# Patient Record
Sex: Male | Born: 1965 | Race: Black or African American | Hispanic: No | Marital: Married | Smoking: Former smoker
Health system: Southern US, Community
[De-identification: ages and names within clinical notes are randomized; demographics above are authoritative.]

## PROBLEM LIST (undated history)

## (undated) DIAGNOSIS — E559 Vitamin D deficiency, unspecified: Secondary | ICD-10-CM

---

## 2003-09-13 ENCOUNTER — Emergency Department (HOSPITAL_COMMUNITY): Admission: EM | Admit: 2003-09-13 | Discharge: 2003-09-13 | Payer: Self-pay | Admitting: Emergency Medicine

## 2007-09-18 ENCOUNTER — Ambulatory Visit (HOSPITAL_COMMUNITY): Admission: RE | Admit: 2007-09-18 | Discharge: 2007-09-18 | Payer: Self-pay | Admitting: Pulmonary Disease

## 2013-10-01 NOTE — H&P (Signed)
  NTS SOAP Note  Vital Signs:  Vitals as of: 3/0/0923: Systolic 300: Diastolic 80: Heart Rate 70: Temp 98.86F: Height 76ft 8.5in: Weight 177Lbs 0 Ounces: BMI 26.52  BMI : 26.52 kg/m2  Subjective: This 48 Years 2 Months old Male presents for of a mass in the left arm.  Has been present for some time, but is increasing in size, and causing him discomfort.  Review of Symptoms:  Constitutional:unremarkable   Head:unremarkable    Eyes:unremarkable   sinus problems Cardiovascular:  unremarkable   Respiratory:  wheezing Gastrointestinal:  unremarkable   Genitourinary:unremarkable       joint pain boils Hematolgic/Lymphatic:unremarkable     Allergic/Immunologic:unremarkable     Past Medical History:    Reviewed  Past Medical History  Surgical History: unrremarkable Medical Problems: none Allergies: nkda Medications: vit D   Social History:Reviewed  Social History  Preferred Language: English Race:  Black or African American Ethnicity: Not Hispanic / Latino Age: 48 Years 2 Months Marital Status:  M Alcohol: 1-2 beers a day   Smoking Status: Current every day smoker reviewed on 09/30/2013 Started Date:  Packs per day: 0.50 Functional Status reviewed on 09/30/2013 ------------------------------------------------ Bathing: Normal Cooking: Normal Dressing: Normal Driving: Normal Eating: Normal Managing Meds: Normal Oral Care: Normal Shopping: Normal Toileting: Normal Transferring: Normal Walking: Normal Cognitive Status reviewed on 09/30/2013 ------------------------------------------------ Attention: Normal Decision Making: Normal Language: Normal Memory: Normal Motor: Normal Perception: Normal Problem Solving: Normal Visual and Spatial: Normal   Family History:  Reviewed  Family Health History Mother, Deceased; Cancer unspecified;  Father     Objective Information: General:  Well appearing, well  nourished in no distress.      4cm ovoid, subcutaneous mass noted in left arm.  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   Patient Education:Alternative treatments to surgery were discussed with patient (and family).  Risks and benefits  of procedure were fully explained to the patient (and family) who gave informed consent. Patient/family questions were addressed.  Follow-up:Pending Surgery

## 2013-10-22 NOTE — Patient Instructions (Signed)
Marco Beard  10/22/2013   Your procedure is scheduled on:  10/29/13  Report to [name] at [place] AM.  Call this number if you have problems the morning of surgery: [phone]   Remember:   Do not eat food or drink liquids after midnight.   Take these medicines the morning of surgery with A SIP OF WATER: none   Do not wear jewelry, make-up or nail polish.  Do not wear lotions, powders, or perfumes. You may wear deodorant.  Do not shave 48 hours prior to surgery. Men may shave face and neck.  Do not bring valuables to the hospital.  [facility] is not responsible                  for any belongings or valuables.               Contacts, dentures or bridgework may not be worn into surgery.  Leave suitcase in the car. After surgery it may be brought to your room.  For patients admitted to the hospital, discharge time is determined by your                treatment team.               Patients discharged the day of surgery will not be allowed to drive  home.  Name and phone number of your driver: family  Special Instructions: Shower using CHG 2 nights before surgery and the night before surgery.  If you shower the day of surgery use CHG.  Use special wash - you have one bottle of CHG for all showers.  You should use approximately 1/3 of the bottle for each shower.   Please read over the following fact sheets that you were given: Surgical Site Infection Prevention, Anesthesia Post-op Instructions and Care and Recovery After Surgery   PATIENT INSTRUCTIONS POST-ANESTHESIA  IMMEDIATELY FOLLOWING SURGERY:  Do not drive or operate machinery for the first twenty four hours after surgery.  Do not make any important decisions for twenty four hours after surgery or while taking narcotic pain medications or sedatives.  If you develop intractable nausea and vomiting or a severe headache please notify your doctor immediately.  FOLLOW-UP:  Please make an appointment with your surgeon as instructed.  You do not need to follow up with anesthesia unless specifically instructed to do so.  WOUND CARE INSTRUCTIONS (if applicable):  Keep a dry clean dressing on the anesthesia/puncture wound site if there is drainage.  Once the wound has quit draining you may leave it open to air.  Generally you should leave the bandage intact for twenty four hours unless there is drainage.  If the epidural site drains for more than 36-48 hours please call the anesthesia department.  QUESTIONS?:  Please feel free to call your physician or the hospital operator if you have any questions, and they will be happy to assist you.

## 2013-10-25 ENCOUNTER — Encounter (HOSPITAL_COMMUNITY)
Admission: RE | Admit: 2013-10-25 | Discharge: 2013-10-25 | Disposition: A | Discharge status: Home / Self Care | Payer: BC Managed Care – PPO | Source: Ambulatory Visit | Attending: General Surgery | Admitting: General Surgery

## 2013-10-25 ENCOUNTER — Encounter (HOSPITAL_COMMUNITY): Payer: Self-pay

## 2013-10-25 DIAGNOSIS — Z01812 Encounter for preprocedural laboratory examination: Secondary | ICD-10-CM | POA: Insufficient documentation

## 2013-10-25 HISTORY — DX: Vitamin D deficiency, unspecified: E55.9

## 2013-10-25 LAB — CBC WITH DIFFERENTIAL/PLATELET
BASOS ABS: 0 10*3/uL (ref 0.0–0.1)
Basophils Relative: 0 % (ref 0–1)
EOS ABS: 0.1 10*3/uL (ref 0.0–0.7)
EOS PCT: 1 % (ref 0–5)
HEMATOCRIT: 41.9 % (ref 39.0–52.0)
Hemoglobin: 13.9 g/dL (ref 13.0–17.0)
LYMPHS PCT: 35 % (ref 12–46)
Lymphs Abs: 2.1 10*3/uL (ref 0.7–4.0)
MCH: 28.1 pg (ref 26.0–34.0)
MCHC: 33.2 g/dL (ref 30.0–36.0)
MCV: 84.8 fL (ref 78.0–100.0)
MONO ABS: 0.6 10*3/uL (ref 0.1–1.0)
Monocytes Relative: 10 % (ref 3–12)
Neutro Abs: 3.3 10*3/uL (ref 1.7–7.7)
Neutrophils Relative %: 54 % (ref 43–77)
Platelets: 278 10*3/uL (ref 150–400)
RBC: 4.94 MIL/uL (ref 4.22–5.81)
RDW: 14.3 % (ref 11.5–15.5)
WBC: 6.1 10*3/uL (ref 4.0–10.5)

## 2013-10-25 LAB — BASIC METABOLIC PANEL
BUN: 13 mg/dL (ref 6–23)
CHLORIDE: 105 meq/L (ref 96–112)
CO2: 27 meq/L (ref 19–32)
CREATININE: 1.08 mg/dL (ref 0.50–1.35)
Calcium: 9.3 mg/dL (ref 8.4–10.5)
GFR calc Af Amer: >90 mL/min (ref >90)
GFR calc non Af Amer: 79 mL/min — ABNORMAL LOW (ref >90)
GLUCOSE: 107 mg/dL — AB (ref 70–99)
Potassium: 4 mEq/L (ref 3.7–5.3)
Sodium: 144 mEq/L (ref 137–147)

## 2013-10-29 ENCOUNTER — Ambulatory Visit (HOSPITAL_COMMUNITY): Payer: BC Managed Care – PPO | Admitting: Anesthesiology

## 2013-10-29 ENCOUNTER — Encounter (HOSPITAL_COMMUNITY): Payer: Self-pay | Admitting: *Deleted

## 2013-10-29 ENCOUNTER — Encounter (HOSPITAL_COMMUNITY)
Admission: RE | Disposition: A | Discharge status: Home / Self Care | Payer: Self-pay | Source: Ambulatory Visit | Attending: General Surgery

## 2013-10-29 ENCOUNTER — Ambulatory Visit (HOSPITAL_COMMUNITY)
Admission: RE | Admit: 2013-10-29 | Discharge: 2013-10-29 | Disposition: A | Discharge status: Home / Self Care | Payer: BC Managed Care – PPO | Source: Ambulatory Visit | Attending: General Surgery | Admitting: General Surgery

## 2013-10-29 ENCOUNTER — Encounter (HOSPITAL_COMMUNITY): Payer: BC Managed Care – PPO | Admitting: Anesthesiology

## 2013-10-29 DIAGNOSIS — F17200 Nicotine dependence, unspecified, uncomplicated: Secondary | ICD-10-CM | POA: Insufficient documentation

## 2013-10-29 DIAGNOSIS — D1779 Benign lipomatous neoplasm of other sites: Secondary | ICD-10-CM | POA: Insufficient documentation

## 2013-10-29 HISTORY — PX: MASS EXCISION: SHX2000

## 2013-10-29 SURGERY — EXCISION MASS
Anesthesia: General | Site: Arm Upper | Laterality: Left

## 2013-10-29 MED ORDER — ONDANSETRON HCL 4 MG/2ML IJ SOLN
4.0000 mg | Freq: Once | INTRAMUSCULAR | Status: AC
  Administered 2013-10-29: 4 mg via INTRAVENOUS

## 2013-10-29 MED ORDER — BUPIVACAINE HCL (PF) 0.5 % IJ SOLN
INTRAMUSCULAR | Status: DC | PRN
  Administered 2013-10-29: 3 mL

## 2013-10-29 MED ORDER — ONDANSETRON HCL 4 MG/2ML IJ SOLN
INTRAMUSCULAR | Status: AC
  Filled 2013-10-29: qty 2

## 2013-10-29 MED ORDER — FENTANYL CITRATE 0.05 MG/ML IJ SOLN
INTRAMUSCULAR | Status: AC
Start: 2013-10-29 — End: 2013-10-29
  Filled 2013-10-29: qty 2

## 2013-10-29 MED ORDER — MIDAZOLAM HCL 2 MG/2ML IJ SOLN
1.0000 mg | INTRAMUSCULAR | Status: DC | PRN
  Administered 2013-10-29: 2 mg via INTRAVENOUS

## 2013-10-29 MED ORDER — PROPOFOL 10 MG/ML IV EMUL
INTRAVENOUS | Status: AC
  Filled 2013-10-29: qty 20

## 2013-10-29 MED ORDER — FENTANYL CITRATE 0.05 MG/ML IJ SOLN: 25.0000 ug | INTRAMUSCULAR | Status: DC | PRN

## 2013-10-29 MED ORDER — POVIDONE-IODINE 10 % EX OINT
TOPICAL_OINTMENT | CUTANEOUS | Status: AC
  Filled 2013-10-29: qty 1

## 2013-10-29 MED ORDER — FENTANYL CITRATE 0.05 MG/ML IJ SOLN
INTRAMUSCULAR | Status: DC | PRN
  Administered 2013-10-29 (×2): 25 ug via INTRAVENOUS
  Administered 2013-10-29: 50 ug via INTRAVENOUS

## 2013-10-29 MED ORDER — LACTATED RINGERS IV SOLN
INTRAVENOUS | Status: DC
  Administered 2013-10-29: 07:00:00 via INTRAVENOUS

## 2013-10-29 MED ORDER — POVIDONE-IODINE 10 % OINT PACKET
TOPICAL_OINTMENT | CUTANEOUS | Status: DC | PRN
  Administered 2013-10-29: 1 via TOPICAL

## 2013-10-29 MED ORDER — CHLORHEXIDINE GLUCONATE 4 % EX LIQD: 1.0000 "application " | Freq: Once | CUTANEOUS | Status: DC

## 2013-10-29 MED ORDER — BUPIVACAINE HCL (PF) 0.5 % IJ SOLN
INTRAMUSCULAR | Status: AC
  Filled 2013-10-29: qty 30

## 2013-10-29 MED ORDER — GLYCOPYRROLATE 0.2 MG/ML IJ SOLN
0.2000 mg | Freq: Once | INTRAMUSCULAR | Status: AC
  Administered 2013-10-29: 0.2 mg via INTRAVENOUS

## 2013-10-29 MED ORDER — MIDAZOLAM HCL 2 MG/2ML IJ SOLN
INTRAMUSCULAR | Status: AC
  Filled 2013-10-29: qty 2

## 2013-10-29 MED ORDER — HYDROCODONE-ACETAMINOPHEN 5-325 MG PO TABS: 1.0000 | ORAL_TABLET | Freq: Four times a day (QID) | ORAL | Status: AC | PRN

## 2013-10-29 MED ORDER — ONDANSETRON HCL 4 MG/2ML IJ SOLN: 4.0000 mg | Freq: Once | INTRAMUSCULAR | Status: DC | PRN

## 2013-10-29 MED ORDER — FENTANYL CITRATE 0.05 MG/ML IJ SOLN
25.0000 ug | INTRAMUSCULAR | Status: AC
  Administered 2013-10-29 (×2): 25 ug via INTRAVENOUS

## 2013-10-29 MED ORDER — FENTANYL CITRATE 0.05 MG/ML IJ SOLN
INTRAMUSCULAR | Status: AC
  Filled 2013-10-29: qty 2

## 2013-10-29 MED ORDER — LIDOCAINE HCL (PF) 1 % IJ SOLN
INTRAMUSCULAR | Status: AC
  Filled 2013-10-29: qty 5

## 2013-10-29 MED ORDER — PROPOFOL 10 MG/ML IV BOLUS
INTRAVENOUS | Status: DC | PRN
  Administered 2013-10-29: 170 mg via INTRAVENOUS
  Administered 2013-10-29: 30 mg via INTRAVENOUS

## 2013-10-29 MED ORDER — LIDOCAINE HCL (CARDIAC) 10 MG/ML IV SOLN
INTRAVENOUS | Status: DC | PRN
  Administered 2013-10-29: 20 mg via INTRAVENOUS

## 2013-10-29 MED ORDER — KETOROLAC TROMETHAMINE 30 MG/ML IJ SOLN
30.0000 mg | Freq: Once | INTRAMUSCULAR | Status: AC
  Administered 2013-10-29: 30 mg via INTRAVENOUS

## 2013-10-29 MED ORDER — GLYCOPYRROLATE 0.2 MG/ML IJ SOLN
INTRAMUSCULAR | Status: AC
  Filled 2013-10-29: qty 1

## 2013-10-29 MED ORDER — 0.9 % SODIUM CHLORIDE (POUR BTL) OPTIME
TOPICAL | Status: DC | PRN
  Administered 2013-10-29: 1000 mL

## 2013-10-29 MED ORDER — KETOROLAC TROMETHAMINE 30 MG/ML IJ SOLN
INTRAMUSCULAR | Status: AC
  Filled 2013-10-29: qty 1

## 2013-10-29 SURGICAL SUPPLY — 37 items
BAG HAMPER (MISCELLANEOUS) ×2 IMPLANT
BLADE 15 SAFETY STRL DISP (BLADE) ×2 IMPLANT
CLOTH BEACON ORANGE TIMEOUT ST (SAFETY) ×2 IMPLANT
COVER LIGHT HANDLE STERIS (MISCELLANEOUS) IMPLANT
COVER SURGICAL LIGHT HANDLE (MISCELLANEOUS) ×2 IMPLANT
DECANTER SPIKE VIAL GLASS SM (MISCELLANEOUS) ×2 IMPLANT
DERMABOND ADVANCED (GAUZE/BANDAGES/DRESSINGS)
DERMABOND ADVANCED .7 DNX12 (GAUZE/BANDAGES/DRESSINGS) IMPLANT
DRAPE EENT ADH APERT 15X15 STR (DRAPES) ×2 IMPLANT
DRSG TEGADERM 4X4.75 (GAUZE/BANDAGES/DRESSINGS) ×2 IMPLANT
DURAPREP 26ML APPLICATOR (WOUND CARE) ×2 IMPLANT
ELECT NEEDLE TIP 2.8 STRL (NEEDLE) IMPLANT
ELECT REM PT RETURN 9FT ADLT (ELECTROSURGICAL) ×2
ELECTRODE REM PT RTRN 9FT ADLT (ELECTROSURGICAL) ×1 IMPLANT
FORMALIN 10 PREFIL 120ML (MISCELLANEOUS) ×2 IMPLANT
GLOVE BIOGEL PI IND STRL 8 (GLOVE) ×1 IMPLANT
GLOVE BIOGEL PI INDICATOR 8 (GLOVE) ×1
GLOVE ECLIPSE 7.0 STRL STRAW (GLOVE) ×2 IMPLANT
GLOVE ECLIPSE 7.5 STRL STRAW (GLOVE) IMPLANT
GLOVE EXAM NITRILE LRG STRL (GLOVE) ×2 IMPLANT
GLOVE INDICATOR 7.5 STRL GRN (GLOVE) ×2 IMPLANT
GLOVE SS N UNI LF 7.5 STRL (GLOVE) ×2 IMPLANT
GOWN STRL REUS W/TWL LRG LVL3 (GOWN DISPOSABLE) ×4 IMPLANT
KIT ROOM TURNOVER APOR (KITS) ×2 IMPLANT
MANIFOLD NEPTUNE II (INSTRUMENTS) ×2 IMPLANT
NS IRRIG 1000ML POUR BTL (IV SOLUTION) ×2 IMPLANT
PACK MINOR (CUSTOM PROCEDURE TRAY) ×2 IMPLANT
PAD ARMBOARD 7.5X6 YLW CONV (MISCELLANEOUS) ×2 IMPLANT
SET BASIN LINEN APH (SET/KITS/TRAYS/PACK) ×2 IMPLANT
SPONGE GAUZE 2X2 8PLY STRL LF (GAUZE/BANDAGES/DRESSINGS) ×4 IMPLANT
SUT ETHILON 3 0 FSL (SUTURE) IMPLANT
SUT PROLENE 3 0 PS 2 (SUTURE) ×6 IMPLANT
SUT PROLENE 4 0 PS 2 18 (SUTURE) IMPLANT
SUT VIC AB 3-0 SH 27 (SUTURE)
SUT VIC AB 3-0 SH 27X BRD (SUTURE) IMPLANT
SUT VIC AB 4-0 PS2 27 (SUTURE) IMPLANT
SYR CONTROL 10ML LL (SYRINGE) ×2 IMPLANT

## 2013-10-29 NOTE — Discharge Instructions (Signed)

## 2013-10-29 NOTE — Anesthesia Procedure Notes (Signed)
Procedure Name: LMA Insertion Date/Time: 10/29/2013 7:34 AM Performed by: Vista Deck Pre-anesthesia Checklist: Patient identified, Patient being monitored, Emergency Drugs available, Timeout performed and Suction available Patient Re-evaluated:Patient Re-evaluated prior to inductionOxygen Delivery Method: Circle System Utilized Preoxygenation: Pre-oxygenation with 100% oxygen Intubation Type: IV induction Ventilation: Mask ventilation without difficulty LMA: LMA inserted LMA Size: 4.0 Number of attempts: 1 Placement Confirmation: positive ETCO2 and breath sounds checked- equal and bilateral Tube secured with: Tape

## 2013-10-29 NOTE — Anesthesia Preprocedure Evaluation (Signed)
Anesthesia Evaluation  Patient identified by MRN, date of birth, ID band Patient awake    Reviewed: Allergy & Precautions, H&P , NPO status , Patient's Chart, lab work & pertinent test results  Airway Mallampati: I TM Distance: >3 FB Neck ROM: Full    Dental  (+) Teeth Intact, Dental Advisory Given   Pulmonary Current Smoker,  breath sounds clear to auscultation        Cardiovascular negative cardio ROS  Rhythm:Regular Rate:Normal     Neuro/Psych    GI/Hepatic negative GI ROS,   Endo/Other    Renal/GU      Musculoskeletal   Abdominal   Peds  Hematology   Anesthesia Other Findings   Reproductive/Obstetrics                           Anesthesia Physical Anesthesia Plan  ASA: II  Anesthesia Plan: General   Post-op Pain Management:    Induction: Intravenous  Airway Management Planned: LMA  Additional Equipment:   Intra-op Plan:   Post-operative Plan: Extubation in OR  Informed Consent: I have reviewed the patients History and Physical, chart, labs and discussed the procedure including the risks, benefits and alternatives for the proposed anesthesia with the patient or authorized representative who has indicated his/her understanding and acceptance.     Plan Discussed with:   Anesthesia Plan Comments:         Anesthesia Quick Evaluation

## 2013-10-29 NOTE — Transfer of Care (Signed)
Immediate Anesthesia Transfer of Care Note  Patient: Marco Beard  Procedure(s) Performed: Procedure(s) (LRB): EXCISION 5CM SOFT TISSUE NEOPLASM OF ARM (Left)  Patient Location: PACU  Anesthesia Type: General  Level of Consciousness: awake  Airway & Oxygen Therapy: Patient Spontanous Breathing and non-rebreather face mask  Post-op Assessment: Report given to PACU RN, Post -op Vital signs reviewed and stable and Patient moving all extremities  Post vital signs: Reviewed and stable  Complications: No apparent anesthesia complications

## 2013-10-29 NOTE — Anesthesia Postprocedure Evaluation (Signed)
  Anesthesia Post-op Note  Patient: Marco Beard  Procedure(s) Performed: Procedure(s): EXCISION 5CM SOFT TISSUE NEOPLASM OF ARM (Left)  Patient Location: PACU  Anesthesia Type:General  Level of Consciousness: sedated and patient cooperative  Airway and Oxygen Therapy: Patient Spontanous Breathing and non-rebreather face mask  Post-op Pain: none  Post-op Assessment: Post-op Vital signs reviewed, Patient's Cardiovascular Status Stable and Respiratory Function Stable  Post-op Vital Signs: Reviewed and stable  Last Vitals:  Filed Vitals:   10/29/13 0824  BP: 101/56  Pulse: 68  Temp: 36.4 C  Resp: 20    Complications: No apparent anesthesia complications

## 2013-10-29 NOTE — Interval H&P Note (Signed)
History and Physical Interval Note:  10/29/2013 7:13 AM  Marco Beard  has presented today for surgery, with the diagnosis of neoplasm of left arm  The various methods of treatment have been discussed with the patient and family. After consideration of risks, benefits and other options for treatment, the patient has consented to  Procedure(s): EXCISION 5CM SOFT TISSUE NEOPLASM OF ARM (Left) as a surgical intervention .  The patient's history has been reviewed, patient examined, no change in status, stable for surgery.  I have reviewed the patient's chart and labs.  Questions were answered to the patient's satisfaction.     Jamesetta So

## 2013-10-29 NOTE — Op Note (Signed)
Patient:  Marco Beard  DOB:  12-04-1965  MRN:  [id number]   Preop Diagnosis:  Soft tissue neoplasm, left arm  Postop Diagnosis:  Same  Procedure:  Excision of 5 cm soft tissue neoplasm, left arm  Surgeon:  Aviva Signs, M.D.  Anes:  General  Indications:  Patient is a 48 year old black male who presents with an enlarging soft tissue mass in the left arm. The risks and benefits of the procedure were fully explained to the patient, gave informed consent.  Procedure note:  Patient is placed the supine position. After general anesthesia was administered, the left arm was prepped and draped using usual sterile technique with DuraPrep. Surgical site confirmation was performed.  A 5 cm ovoid mass was noted just above the left elbow. A longitudinal incision was made. This was taken down to the mass which appeared to be lipomatous in nature, though it interdigitated with the muscle down to the humerus. It was carefully excised with minimal use of Bovie electrocautery. It was sent to pathology further examination. 0.5% Sensorcaine was instilled the surrounding wound. The incision was closed using 3-0 Prolene vertical mattress sutures. Betadine ointment and dressed a dressing were applied.  All tape and needle counts were correct at the end of the procedure. Patient was awakened and transferred to PACU in stable condition.  Complications:  None  EBL:  Minimal  Specimen:  Soft tissue mass, left arm

## 2013-11-02 ENCOUNTER — Encounter (HOSPITAL_COMMUNITY): Payer: Self-pay | Admitting: General Surgery

## 2014-09-13 ENCOUNTER — Other Ambulatory Visit (HOSPITAL_COMMUNITY): Payer: Self-pay | Admitting: General Surgery

## 2014-09-13 DIAGNOSIS — R2230 Localized swelling, mass and lump, unspecified upper limb: Secondary | ICD-10-CM

## 2014-09-13 DIAGNOSIS — R221 Localized swelling, mass and lump, neck: Secondary | ICD-10-CM

## 2014-09-16 ENCOUNTER — Ambulatory Visit (HOSPITAL_COMMUNITY)
Admission: RE | Admit: 2014-09-16 | Discharge: 2014-09-16 | Disposition: A | Discharge status: Home / Self Care | Payer: BLUE CROSS/BLUE SHIELD | Source: Ambulatory Visit | Attending: General Surgery | Admitting: General Surgery

## 2014-09-16 DIAGNOSIS — M259 Joint disorder, unspecified: Secondary | ICD-10-CM | POA: Diagnosis not present

## 2014-09-16 DIAGNOSIS — R221 Localized swelling, mass and lump, neck: Secondary | ICD-10-CM | POA: Insufficient documentation

## 2014-09-16 DIAGNOSIS — R2230 Localized swelling, mass and lump, unspecified upper limb: Secondary | ICD-10-CM

## 2014-09-16 MED ORDER — IOHEXOL 300 MG/ML  SOLN: 125.0000 mL | Freq: Once | INTRAMUSCULAR | Status: AC | PRN

## 2014-09-22 ENCOUNTER — Ambulatory Visit: Payer: BLUE CROSS/BLUE SHIELD | Admitting: Orthopedic Surgery

## 2014-10-05 ENCOUNTER — Ambulatory Visit (INDEPENDENT_AMBULATORY_CARE_PROVIDER_SITE_OTHER): Payer: BLUE CROSS/BLUE SHIELD | Admitting: Orthopedic Surgery

## 2014-10-05 VITALS — BP 128/88 | Ht 68.0 in | Wt 182.0 lb

## 2014-10-05 DIAGNOSIS — C4911 Malignant neoplasm of connective and soft tissue of right upper limb, including shoulder: Secondary | ICD-10-CM

## 2014-10-05 NOTE — Patient Instructions (Signed)
We will refer you to [facility], Dr Mylo Red for second opinion

## 2014-10-06 ENCOUNTER — Encounter: Payer: Self-pay | Admitting: Orthopedic Surgery

## 2014-10-06 NOTE — Progress Notes (Addendum)
Patient ID: Marco Beard, male   DOB: 06-18-1966, 49 y.o.   MRN: [id number] Patient ID: Marco Beard, male   DOB: [birth year], 49 y.o.   MRN: [id number]  Chief Complaint  Patient presents with  . Back Problem    Lipoma on right scapula, referred by Dr. Romona Curls. CT scan at [facility].     Marco Beard is a 49 y.o. male.   HPI The patient is referred to me for evaluation of a lipoma on the right side of the patient's shoulder and neck he's already had a CT scan. He complains of no pain just swelling. He noticed the lesion several months ago and wanted it checked out. It is not affecting his shoulder motion in any way it is not affecting any strength in his arm or function of his shoulder. Review of Systems In terms of review of systems he does say that he occasionally has some night sweats seasonal at, wheezing and cough. Dental problems and sinusitis. He has not noticed any weight loss fever or chills. No neurologic deficits in the upper extremity. He does have history of prior lipomas being excised  Past Medical History  Diagnosis Date  . Vitamin D deficiency     Past Surgical History  Procedure Laterality Date  . Mass excision Left 10/29/2013    Procedure: EXCISION 5CM SOFT TISSUE NEOPLASM OF ARM;  Surgeon: Jamesetta So, MD;  Location: [facility];  Service: General;  Laterality: Left;    History reviewed. No pertinent family history.  Social History History  Substance Use Topics  . Smoking status: Current Every Day Smoker -- 0.50 packs/day  . Smokeless tobacco: Not on file  . Alcohol Use: 1.8 oz/week    3 Cans of beer per week     Comment: occ on weekend    No Known Allergies  Current Outpatient Prescriptions  Medication Sig Dispense Refill  . HYDROcodone-acetaminophen (NORCO/VICODIN) 5-325 MG per tablet Take 1-2 tablets by mouth every 6 (six) hours as needed for moderate pain. 40 tablet 0  . Vitamin D, Ergocalciferol, (DRISDOL) 50000 UNITS CAPS capsule Take 50,000 Units  by mouth every 7 (seven) days.     No current facility-administered medications for this visit.       Physical Exam Blood pressure 128/88, height 5\' 8"  (1.727 m), weight 182 lb (82.555 kg). Physical Exam The patient is well developed well nourished and well groomed. Orientation to person place and time is normal  Mood is pleasant. Ambulatory status normal ambulatory status noncontributory Neck and shoulder exam reveals full range of motion of the cervical spine without tenderness there is a mass noted in the right paraspinal musculature and trapezius muscle which tracks down above the right periscapular region it is a rather large lesion. He has full range of motion of his right shoulder with no axillary or clavicular area or supraclavicular area or. Spinal lymphadenopathy. His shoulder remain stable muscle tone is normal strength is good scans normal sensations excellent and vascularity is normal as well  Data Reviewed We do have a CT scan which shows that the lesion has some characteristics of liposarcoma.  Assessment Encounter Diagnosis  Name Primary?  . 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 I told him that I'm happy to remove this after the opinion as long as it's a benign lipoma. I would like to have general surgery assist in the removal of this  lesion as it does track around the cervical area  These are the CT scan results IMPRESSION: 1. Palpable area of concern corresponds to a large 3.7 x 4.0 x 12 cm (AP x transverse x CC) circumscribed lipomatous mass deep to the superficial muscle layer and bounded by the right trapezius, levator scapula, and erector spinae muscles. The entire mass may be benign and in fact the cephalad 2/3 of the mass has simple density in keeping with a simple lipoma (see sagittal series 10 image 44), however, the bottom 2/3 of the mass has more  increased reticular density which can be seen with low grade liposarcoma. There is no associated lymphadenopathy or osseous changes. 2. Right lower lobe peribronchovascular pulmonary opacity compatible with mild or developing bronchopneumonia. Has the patient recently had flu/pneumonia symptoms? If so this may reflect a resolving infection. 3. Extensive abnormal periapical lucency about the maxillary dentition indicating chronic dental inflammation. Recommend dental followup.

## 2014-10-13 ENCOUNTER — Telehealth: Payer: Self-pay | Admitting: Orthopedic Surgery

## 2014-10-13 NOTE — Telephone Encounter (Signed)
Patient called, asking about referral to [facility] hasn't heard - please advise, cell# [phone]

## 2014-10-18 ENCOUNTER — Other Ambulatory Visit: Payer: Self-pay | Admitting: *Deleted

## 2014-10-18 ENCOUNTER — Telehealth: Payer: Self-pay | Admitting: *Deleted

## 2014-10-18 DIAGNOSIS — C4911 Malignant neoplasm of connective and soft tissue of right upper limb, including shoulder: Secondary | ICD-10-CM

## 2014-10-18 NOTE — Telephone Encounter (Signed)
APPT 4/26 @ 9:30AM W/ DR EMORY  [address] [place] [address] FLOOR  CALLED PATIENT, NO ANSWER

## 2014-10-18 NOTE — Telephone Encounter (Signed)
Patient is aware 

## 2014-10-18 NOTE — Telephone Encounter (Signed)
Patient aware.

## 2014-10-18 NOTE — Telephone Encounter (Signed)
REFERRAL FAXED TO [facility] TO DR Mylo Red

## 2015-08-04 ENCOUNTER — Ambulatory Visit (INDEPENDENT_AMBULATORY_CARE_PROVIDER_SITE_OTHER): Payer: BLUE CROSS/BLUE SHIELD | Admitting: Urology

## 2015-08-04 DIAGNOSIS — M545 Low back pain: Secondary | ICD-10-CM | POA: Diagnosis not present

## 2015-08-04 DIAGNOSIS — N50812 Left testicular pain: Secondary | ICD-10-CM | POA: Diagnosis not present

## 2015-08-04 DIAGNOSIS — N50811 Right testicular pain: Secondary | ICD-10-CM

## 2015-10-06 ENCOUNTER — Ambulatory Visit (INDEPENDENT_AMBULATORY_CARE_PROVIDER_SITE_OTHER): Payer: BLUE CROSS/BLUE SHIELD | Admitting: Urology

## 2015-10-06 DIAGNOSIS — N50811 Right testicular pain: Secondary | ICD-10-CM | POA: Diagnosis not present

## 2015-10-06 DIAGNOSIS — N50812 Left testicular pain: Secondary | ICD-10-CM | POA: Diagnosis not present

## 2015-12-15 ENCOUNTER — Ambulatory Visit (INDEPENDENT_AMBULATORY_CARE_PROVIDER_SITE_OTHER): Payer: BLUE CROSS/BLUE SHIELD | Admitting: Urology

## 2015-12-15 DIAGNOSIS — N5201 Erectile dysfunction due to arterial insufficiency: Secondary | ICD-10-CM

## 2015-12-15 DIAGNOSIS — M545 Low back pain: Secondary | ICD-10-CM

## 2015-12-15 DIAGNOSIS — N50811 Right testicular pain: Secondary | ICD-10-CM

## 2015-12-15 DIAGNOSIS — N50812 Left testicular pain: Secondary | ICD-10-CM | POA: Diagnosis not present

## 2016-02-16 ENCOUNTER — Ambulatory Visit: Payer: BLUE CROSS/BLUE SHIELD | Admitting: Urology

## 2016-03-01 ENCOUNTER — Ambulatory Visit: Payer: BLUE CROSS/BLUE SHIELD | Admitting: Urology

## 2016-03-22 ENCOUNTER — Ambulatory Visit (INDEPENDENT_AMBULATORY_CARE_PROVIDER_SITE_OTHER): Payer: BLUE CROSS/BLUE SHIELD | Admitting: Urology

## 2016-03-22 DIAGNOSIS — N50812 Left testicular pain: Secondary | ICD-10-CM | POA: Diagnosis not present

## 2016-03-22 DIAGNOSIS — M545 Low back pain: Secondary | ICD-10-CM

## 2016-03-22 DIAGNOSIS — N5201 Erectile dysfunction due to arterial insufficiency: Secondary | ICD-10-CM | POA: Diagnosis not present

## 2016-03-22 DIAGNOSIS — N4341 Spermatocele of epididymis, single: Secondary | ICD-10-CM | POA: Diagnosis not present

## 2016-04-02 ENCOUNTER — Other Ambulatory Visit: Payer: Self-pay | Admitting: Urology

## 2016-04-02 DIAGNOSIS — M545 Low back pain: Secondary | ICD-10-CM

## 2016-04-08 ENCOUNTER — Encounter (HOSPITAL_COMMUNITY): Payer: Self-pay

## 2016-04-08 ENCOUNTER — Ambulatory Visit (HOSPITAL_COMMUNITY)
Admission: RE | Admit: 2016-04-08 | Discharge: 2016-04-08 | Disposition: A | Discharge status: Home / Self Care | Payer: BLUE CROSS/BLUE SHIELD | Source: Ambulatory Visit | Attending: Urology | Admitting: Urology

## 2016-04-08 DIAGNOSIS — M545 Low back pain: Secondary | ICD-10-CM

## 2016-04-09 ENCOUNTER — Ambulatory Visit (HOSPITAL_COMMUNITY)
Admission: RE | Admit: 2016-04-09 | Discharge: 2016-04-09 | Disposition: A | Discharge status: Home / Self Care | Payer: BLUE CROSS/BLUE SHIELD | Source: Ambulatory Visit | Attending: Urology | Admitting: Urology

## 2016-04-09 ENCOUNTER — Encounter (HOSPITAL_COMMUNITY): Payer: Self-pay

## 2016-04-09 ENCOUNTER — Ambulatory Visit (HOSPITAL_COMMUNITY): Admission: RE | Admit: 2016-04-09 | Payer: BLUE CROSS/BLUE SHIELD | Source: Ambulatory Visit

## 2016-04-09 ENCOUNTER — Other Ambulatory Visit: Payer: Self-pay | Admitting: Urology

## 2016-04-09 DIAGNOSIS — Z1810 Retained metal fragments, unspecified: Secondary | ICD-10-CM | POA: Insufficient documentation

## 2016-04-09 DIAGNOSIS — M795 Residual foreign body in soft tissue: Secondary | ICD-10-CM

## 2016-04-09 DIAGNOSIS — M545 Low back pain: Secondary | ICD-10-CM

## 2016-04-09 DIAGNOSIS — Z01818 Encounter for other preprocedural examination: Secondary | ICD-10-CM | POA: Diagnosis present

## 2016-05-29 ENCOUNTER — Other Ambulatory Visit: Payer: Self-pay | Admitting: Urology

## 2016-05-29 DIAGNOSIS — M545 Low back pain, unspecified: Secondary | ICD-10-CM

## 2016-05-29 DIAGNOSIS — N50812 Left testicular pain: Secondary | ICD-10-CM

## 2016-06-03 ENCOUNTER — Ambulatory Visit (HOSPITAL_COMMUNITY)
Admission: RE | Admit: 2016-06-03 | Discharge: 2016-06-03 | Disposition: A | Discharge status: Home / Self Care | Payer: BLUE CROSS/BLUE SHIELD | Source: Ambulatory Visit | Attending: Urology | Admitting: Urology

## 2016-06-03 DIAGNOSIS — M545 Low back pain, unspecified: Secondary | ICD-10-CM

## 2016-06-03 DIAGNOSIS — M5136 Other intervertebral disc degeneration, lumbar region: Secondary | ICD-10-CM | POA: Insufficient documentation

## 2016-06-03 DIAGNOSIS — M48061 Spinal stenosis, lumbar region without neurogenic claudication: Secondary | ICD-10-CM | POA: Insufficient documentation

## 2016-06-03 DIAGNOSIS — N50812 Left testicular pain: Secondary | ICD-10-CM | POA: Diagnosis present

## 2016-06-14 ENCOUNTER — Ambulatory Visit: Payer: BLUE CROSS/BLUE SHIELD | Admitting: Urology
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
## 2017-05-16 ENCOUNTER — Ambulatory Visit: Payer: BLUE CROSS/BLUE SHIELD | Admitting: Urology

## 2017-05-16 DIAGNOSIS — N50811 Right testicular pain: Secondary | ICD-10-CM

## 2017-05-16 DIAGNOSIS — N5201 Erectile dysfunction due to arterial insufficiency: Secondary | ICD-10-CM

## 2017-05-16 DIAGNOSIS — M4807 Spinal stenosis, lumbosacral region: Secondary | ICD-10-CM | POA: Diagnosis not present

## 2017-05-16 DIAGNOSIS — N50812 Left testicular pain: Secondary | ICD-10-CM | POA: Diagnosis not present

## 2018-05-22 ENCOUNTER — Ambulatory Visit: Payer: BLUE CROSS/BLUE SHIELD | Admitting: Urology

## 2018-05-22 DIAGNOSIS — N50812 Left testicular pain: Secondary | ICD-10-CM

## 2018-05-22 DIAGNOSIS — N50811 Right testicular pain: Secondary | ICD-10-CM

## 2018-05-22 DIAGNOSIS — N5201 Erectile dysfunction due to arterial insufficiency: Secondary | ICD-10-CM | POA: Diagnosis not present

## 2018-06-05 ENCOUNTER — Encounter: Payer: Self-pay | Admitting: Orthopaedic Surgery

## 2018-10-25 ENCOUNTER — Encounter: Payer: Self-pay | Admitting: Orthopaedic Surgery

## 2019-07-09 ENCOUNTER — Ambulatory Visit: Payer: BLUE CROSS/BLUE SHIELD | Admitting: Urology

## 2019-07-16 ENCOUNTER — Telehealth: Payer: Self-pay | Admitting: Urology

## 2019-07-16 ENCOUNTER — Other Ambulatory Visit: Payer: Self-pay

## 2019-07-16 DIAGNOSIS — N5201 Erectile dysfunction due to arterial insufficiency: Secondary | ICD-10-CM

## 2019-07-16 MED ORDER — SILDENAFIL CITRATE 20 MG PO TABS: ORAL_TABLET | ORAL | 3 refills | Status: DC
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
## 2019-07-16 NOTE — Telephone Encounter (Signed)
He can have a refill.

## 2019-07-16 NOTE — Telephone Encounter (Signed)
Prescription sent in to [facility]. Called pt. No answer.

## 2019-08-13 ENCOUNTER — Ambulatory Visit: Payer: BC Managed Care – PPO | Admitting: Urology

## 2019-09-24 ENCOUNTER — Ambulatory Visit: Payer: BC Managed Care – PPO | Admitting: Urology

## 2019-09-24 ENCOUNTER — Other Ambulatory Visit: Payer: Self-pay

## 2019-09-24 VITALS — BP 122/75 | HR 76 | Temp 98.6°F | Ht 68.0 in | Wt 177.0 lb

## 2019-09-24 DIAGNOSIS — N5201 Erectile dysfunction due to arterial insufficiency: Secondary | ICD-10-CM | POA: Diagnosis not present

## 2019-09-24 DIAGNOSIS — N50812 Left testicular pain: Secondary | ICD-10-CM | POA: Diagnosis not present

## 2019-09-24 DIAGNOSIS — L729 Follicular cyst of the skin and subcutaneous tissue, unspecified: Secondary | ICD-10-CM

## 2019-09-24 DIAGNOSIS — N50811 Right testicular pain: Secondary | ICD-10-CM | POA: Diagnosis not present

## 2019-09-24 LAB — POCT URINALYSIS DIPSTICK
Bilirubin, UA: NEGATIVE
Blood, UA: NEGATIVE
Glucose, UA: NEGATIVE
Nitrite, UA: NEGATIVE
Protein, UA: POSITIVE — AB
Spec Grav, UA: 1.02 (ref 1.010–1.025)
Urobilinogen, UA: 1 E.U./dL
pH, UA: 7 (ref 5.0–8.0)

## 2019-09-24 MED ORDER — SILDENAFIL CITRATE 20 MG PO TABS: ORAL_TABLET | ORAL | 3 refills | Status: DC

## 2019-09-24 NOTE — Progress Notes (Signed)
Subjective:  1. Erectile dysfunction due to arterial insufficiency   2. Scrotal cyst   3. Pain in both testicles      Mr. Marco Beard returns today in f/u for his history of testicular pain and a scrotal cyst that might be larger but is no longer painful.  The bilateral groin pain, right > left, has largely abated.  He has minimal voiding complaints and his IPSS is 2.   He has ED and has been using sildenafil with success.   He has a mid scrotal cyst that he thinks is enlarged a bit more but he has no pain.    He has cervical disc disease and is going to have surgery since he is getting right arm weakness.    IPSS    Row Name 09/24/19 1300         International Prostate Symptom Score   How often have you had the sensation of not emptying your bladder?  Not at All     How often have you had to urinate less than every two hours?  Not at All     How often have you found you stopped and started again several times when you urinated?  Not at All     How often have you found it difficult to postpone urination?  Less than 1 in 5 times     How often have you had a weak urinary stream?  Not at All     How often have you had to strain to start urination?  Not at All     How many times did you typically get up at night to urinate?  1 Time     Total IPSS Score  2       Quality of Life due to urinary symptoms   If you were to spend the rest of your life with your urinary condition just the way it is now how would you feel about that?  Delighted         ROS:  ROS:  A complete review of systems was performed.  All systems are negative except for pertinent findings as noted.   Review of Systems  All other systems reviewed and are negative.   No Known Allergies  Outpatient Encounter Medications as of 09/24/2019  Medication Sig  . dorzolamide-timolol (COSOPT) 22.3-6.8 MG/ML ophthalmic solution PLACE 1 DROP INTO RIGHT EYE TWICE DAILY  . gabapentin (NEURONTIN) 100 MG capsule Take 1-3 caps  by mouth nightly  . sildenafil (REVATIO) 20 MG tablet 1 to 5 tablets as needed by mouth for intimacy  . Vitamin D, Ergocalciferol, (DRISDOL) 50000 UNITS CAPS capsule Take 50,000 Units by mouth every 7 (seven) days.  Marland Kitchen VYZULTA 0.024 % SOLN Place 1 drop into the right eye at bedtime.  . [DISCONTINUED] sildenafil (REVATIO) 20 MG tablet 1 to 5 tablets as needed by mouth for intimacy   No facility-administered encounter medications on file as of 09/24/2019.    Past Medical History:  Diagnosis Date  . Vitamin D deficiency     Past Surgical History:  Procedure Laterality Date  . MASS EXCISION Left 10/29/2013   Procedure: EXCISION 5CM SOFT TISSUE NEOPLASM OF ARM;  Surgeon: Jamesetta So, MD;  Location: [facility];  Service: General;  Laterality: Left;    Social History   Socioeconomic History  . Marital status: Married    Spouse name: Not on file  . Number of children: Not on file  . Years of education: Not on  file  . Highest education level: Not on file  Occupational History  . Not on file  Tobacco Use  . Smoking status: Current Every Day Smoker    Packs/day: 0.50  Substance and Sexual Activity  . Alcohol use: Yes    Alcohol/week: 3.0 standard drinks    Types: 3 Cans of beer per week    Comment: occ on weekend  . Drug use: No  . Sexual activity: Not on file  Other Topics Concern  . Not on file  Social History Narrative  . Not on file   Social Determinants of Health   Financial Resource Strain:   . Difficulty of Paying Living Expenses:   Food Insecurity:   . Worried About Charity fundraiser in the Last Year:   . Arboriculturist in the Last Year:   Transportation Needs:   . Film/video editor (Medical):   Marland Kitchen Lack of Transportation (Non-Medical):   Physical Activity:   . Days of Exercise per Week:   . Minutes of Exercise per Session:   Stress:   . Feeling of Stress :   Social Connections:   . Frequency of Communication with Friends and Family:   . Frequency of Social  Gatherings with Friends and Family:   . Attends Religious Services:   . Active Member of Clubs or Organizations:   . Attends Archivist Meetings:   Marland Kitchen Marital Status:   Intimate Partner Violence:   . Fear of Current or Ex-Partner:   . Emotionally Abused:   Marland Kitchen Physically Abused:   . Sexually Abused:     No family history on file.     Objective: BP 122/75   Pulse 76   Temp 98.6 F (37 C)   Ht 5\' 8"  (1.727 m)   Wt 177 lb (80.3 kg)   BMI 26.91 kg/m     Physical Exam Vitals reviewed.  Constitutional:      Appearance: Normal appearance.  Genitourinary:    Comments: There is a 43mm cyst in the right posterior scrotum that is minimally changed.  The genital exam is otherwise unremarkable.  Neurological:     Mental Status: He is alert.     Lab Results:  Results for orders placed or performed in visit on 09/24/19 (from the past 24 hour(s))  POCT urinalysis dipstick     Status: Abnormal   Collection Time: 09/24/19  1:21 PM  Result Value Ref Range   Color, UA yellow    Clarity, UA     Glucose, UA Negative Negative   Bilirubin, UA neg    Ketones, UA trace    Spec Grav, UA 1.020 1.010 - 1.025   Blood, UA neg    pH, UA 7.0 5.0 - 8.0   Protein, UA Positive (A) Negative   Urobilinogen, UA 1.0 0.2 or 1.0 E.U./dL   Nitrite, UA neg    Leukocytes, UA Trace (A) Negative   Appearance     Odor final     BMET No results for input(s): NA, K, CL, CO2, GLUCOSE, BUN, CREATININE, CALCIUM in the last 72 hours. PSA No results found for: PSA No results found for: TESTOSTERONE    Studies/Results: No results found.    Assessment & Plan: ED.  He is doing well on sildenafil which I refilled.  Scrotal cyst.  No major change and his groin pain has improved.  I discussed prostate cancer screening but he wants to hold off until after he has had his  cervical disc surgery.   Meds ordered this encounter  Medications  . sildenafil (REVATIO) 20 MG tablet    Sig: 1 to 5  tablets as needed by mouth for intimacy    Dispense:  30 tablet    Refill:  3     Orders Placed This Encounter  Procedures  . POCT urinalysis dipstick      Return in about 1 year (around 09/23/2020) for for routine f/u.Marland Kitchen   CC: System, [place] Not In      Irine Seal 09/24/2019

## 2020-03-05 ENCOUNTER — Other Ambulatory Visit: Payer: Self-pay | Admitting: Urology

## 2020-03-05 DIAGNOSIS — N5201 Erectile dysfunction due to arterial insufficiency: Secondary | ICD-10-CM

## 2020-06-21 ENCOUNTER — Other Ambulatory Visit: Payer: Self-pay

## 2020-06-21 ENCOUNTER — Telehealth: Payer: Self-pay

## 2020-06-21 DIAGNOSIS — N5201 Erectile dysfunction due to arterial insufficiency: Secondary | ICD-10-CM

## 2020-06-21 MED ORDER — SILDENAFIL CITRATE 20 MG PO TABS: ORAL_TABLET | ORAL | 3 refills | Status: DC

## 2020-06-21 NOTE — Telephone Encounter (Signed)
Pt called wanting a refill on Sildenafil. Rx sent and notified. Stated [facility] was wrong pharmacy. Rx was sent to [facility] and [facility] taken out as pharmacy.

## 2020-09-18 NOTE — Progress Notes (Signed)
Subjective:  1. Scrotal cyst   2. Erectile dysfunction due to arterial insufficiency   3. Prostate cancer screening      Marco Beard returns today in f/u for his history of testicular pain and a scrotal cyst.  The cyst is stable and without pain.  He has minimal voiding complaints.   He has ED and has been using sildenafil with success.      ROS:  ROS:  A complete review of systems was performed.  All systems are negative except for pertinent findings as noted.   Review of Systems  All other systems reviewed and are negative.   No Known Allergies  Outpatient Encounter Medications as of 09/21/2020  Medication Sig  . dorzolamide-timolol (COSOPT) 22.3-6.8 MG/ML ophthalmic solution PLACE 1 DROP INTO RIGHT EYE TWICE DAILY  . VYZULTA 0.024 % SOLN Place 1 drop into the right eye at bedtime.  . [DISCONTINUED] gabapentin (NEURONTIN) 100 MG capsule Take 1-3 caps by mouth nightly  . [DISCONTINUED] sildenafil (REVATIO) 20 MG tablet TAKE 1 TO 5 TABLETS AS NEEDED BY MOUTH FOR INTIMACY  . [DISCONTINUED] Vitamin D, Ergocalciferol, (DRISDOL) 50000 UNITS CAPS capsule Take 50,000 Units by mouth every 7 (seven) days.  . sildenafil (REVATIO) 20 MG tablet TAKE 1 TO 5 TABLETS AS NEEDED BY MOUTH FOR INTIMACY  . [DISCONTINUED] sildenafil (REVATIO) 20 MG tablet TAKE 1 TO 5 TABLETS AS NEEDED BY MOUTH FOR INTIMACY   No facility-administered encounter medications on file as of 09/21/2020.    Past Medical History:  Diagnosis Date  . Vitamin D deficiency     Past Surgical History:  Procedure Laterality Date  . MASS EXCISION Left 10/29/2013   Procedure: EXCISION 5CM SOFT TISSUE NEOPLASM OF ARM;  Surgeon: Jamesetta So, MD;  Location: [facility];  Service: General;  Laterality: Left;    Social History   Socioeconomic History  . Marital status: Married    Spouse name: Not on file  . Number of children: Not on file  . Years of education: Not on file  . Highest education level: Not on file   Occupational History  . Not on file  Tobacco Use  . Smoking status: Current Every Day Smoker    Packs/day: 0.50  . Smokeless tobacco: Never Used  Substance and Sexual Activity  . Alcohol use: Yes    Alcohol/week: 3.0 standard drinks    Types: 3 Cans of beer per week    Comment: occ on weekend  . Drug use: No  . Sexual activity: Not on file  Other Topics Concern  . Not on file  Social History Narrative  . Not on file   Social Determinants of Health   Financial Resource Strain: Not on file  Food Insecurity: Not on file  Transportation Needs: Not on file  Physical Activity: Not on file  Stress: Not on file  Social Connections: Not on file  Intimate Partner Violence: Not on file    No family history on file.     Objective: BP (!) 146/81   Pulse 75   Temp 98.6 F (37 C) (Oral)   Ht 5\' 8"  (1.727 m)   Wt 178 lb (80.7 kg)   BMI 27.06 kg/m     Physical Exam Vitals reviewed.  Constitutional:      Appearance: Normal appearance.  Genitourinary:    Comments: There is a 58mm cyst in the right posterior scrotum that is minimally changed.  The genital exam is otherwise unremarkable.   AP without lesions. NST  without mass. Prostate 1.5+ benign. SV non-palpable.  Neurological:     Mental Status: He is alert.     Lab Results:  Results for orders placed or performed in visit on 09/21/20 (from the past 24 hour(s))  Urinalysis, Routine w reflex microscopic     Status: Abnormal   Collection Time: 09/21/20  1:47 PM  Result Value Ref Range   Specific Gravity, UA 1.025 1.005 - 1.030   pH, UA 7.0 5.0 - 7.5   Color, UA Yellow Yellow   Appearance Ur Clear Clear   Leukocytes,UA Negative Negative   Protein,UA Negative Negative/Trace   Glucose, UA Negative Negative   Ketones, UA Negative Negative   RBC, UA Trace (A) Negative   Bilirubin, UA Negative Negative   Urobilinogen, Ur 1.0 0.2 - 1.0 mg/dL   Nitrite, UA Negative Negative   Microscopic Examination See below:     Narrative   Performed at:  [facility] [address], [place], [place]  [id number] Lab Director: Mina Marble MT, Phone:  [phone]  Microscopic Examination     Status: None   Collection Time: 09/21/20  1:47 PM   Urine  Result Value Ref Range   WBC, UA None seen 0 - 5 /hpf   RBC 0-2 0 - 2 /hpf   Epithelial Cells (non renal) 0-10 0 - 10 /hpf   Renal Epithel, UA None seen None seen /hpf   Bacteria, UA None seen None seen/Few   Narrative   Performed at:  [facility] [address] [place], [place]  [id number] Lab Director: Ola, Phone:  [phone]        Studies/Results: No results found.    Assessment & Plan: ED.  He is doing well on sildenafil which I refilled.  Scrotal cyst.  No major change and his groin pain has improved.  Prostate cancer screening.  His exam is benign.     Meds ordered this encounter  Medications  . DISCONTD: sildenafil (REVATIO) 20 MG tablet    Sig: TAKE 1 TO 5 TABLETS AS NEEDED BY MOUTH FOR INTIMACY    Dispense:  30 tablet    Refill:  11  . sildenafil (REVATIO) 20 MG tablet    Sig: TAKE 1 TO 5 TABLETS AS NEEDED BY MOUTH FOR INTIMACY    Dispense:  90 tablet    Refill:  3    Ignore the 30 tab script.     Orders Placed This Encounter  Procedures  . Microscopic Examination  . Urinalysis, Routine w reflex microscopic  . PSA, total and free      Return in about 1 year (around 09/21/2021).   CC: Pcp, No      Irine Seal 09/21/2020

## 2020-09-21 ENCOUNTER — Other Ambulatory Visit: Payer: Self-pay

## 2020-09-21 ENCOUNTER — Ambulatory Visit (INDEPENDENT_AMBULATORY_CARE_PROVIDER_SITE_OTHER): Payer: BC Managed Care – PPO | Admitting: Urology

## 2020-09-21 ENCOUNTER — Encounter: Payer: Self-pay | Admitting: Urology

## 2020-09-21 VITALS — BP 146/81 | HR 75 | Temp 98.6°F | Ht 68.0 in | Wt 178.0 lb

## 2020-09-21 DIAGNOSIS — Z125 Encounter for screening for malignant neoplasm of prostate: Secondary | ICD-10-CM | POA: Diagnosis not present

## 2020-09-21 DIAGNOSIS — L729 Follicular cyst of the skin and subcutaneous tissue, unspecified: Secondary | ICD-10-CM | POA: Diagnosis not present

## 2020-09-21 DIAGNOSIS — N5201 Erectile dysfunction due to arterial insufficiency: Secondary | ICD-10-CM | POA: Diagnosis not present

## 2020-09-21 LAB — URINALYSIS, ROUTINE W REFLEX MICROSCOPIC
Bilirubin, UA: NEGATIVE
Glucose, UA: NEGATIVE
Ketones, UA: NEGATIVE
Leukocytes,UA: NEGATIVE
Nitrite, UA: NEGATIVE
Protein,UA: NEGATIVE
Specific Gravity, UA: 1.025 (ref 1.005–1.030)
Urobilinogen, Ur: 1 mg/dL (ref 0.2–1.0)
pH, UA: 7 (ref 5.0–7.5)

## 2020-09-21 LAB — MICROSCOPIC EXAMINATION
Bacteria, UA: NONE SEEN
Renal Epithel, UA: NONE SEEN /hpf
WBC, UA: NONE SEEN /hpf (ref 0–5)

## 2020-09-21 MED ORDER — SILDENAFIL CITRATE 20 MG PO TABS: ORAL_TABLET | ORAL | 11 refills | Status: DC

## 2020-09-21 MED ORDER — SILDENAFIL CITRATE 20 MG PO TABS: ORAL_TABLET | ORAL | 3 refills | Status: DC

## 2020-09-22 LAB — PSA, TOTAL AND FREE
PSA, Free Pct: 18.7 %
PSA, Free: 0.28 ng/mL
Prostate Specific Ag, Serum: 1.5 ng/mL (ref 0.0–4.0)

## 2020-09-22 NOTE — Progress Notes (Signed)
Results mailed 

## 2020-09-28 ENCOUNTER — Ambulatory Visit: Payer: BC Managed Care – PPO | Admitting: Urology

## 2020-09-29 ENCOUNTER — Ambulatory Visit: Payer: BC Managed Care – PPO | Admitting: Urology

## 2020-10-25 ENCOUNTER — Encounter: Payer: Self-pay | Admitting: Internal Medicine

## 2021-02-01 ENCOUNTER — Telehealth (INDEPENDENT_AMBULATORY_CARE_PROVIDER_SITE_OTHER): Payer: Self-pay | Admitting: *Deleted

## 2021-02-01 NOTE — Telephone Encounter (Signed)
Patient had left a message on the phone in manager office.  He asked that someone send out his information again on his procedure he is scheduled for September.  Thanks

## 2021-02-02 ENCOUNTER — Ambulatory Visit: Payer: BC Managed Care – PPO | Admitting: Gastroenterology

## 2021-02-02 NOTE — Telephone Encounter (Signed)
Called pt. He was scheduled for OV today. He had to r/s.

## 2021-04-06 ENCOUNTER — Encounter: Payer: Self-pay | Admitting: Internal Medicine

## 2021-05-23 ENCOUNTER — Encounter: Payer: Self-pay | Admitting: *Deleted

## 2021-05-23 ENCOUNTER — Ambulatory Visit (INDEPENDENT_AMBULATORY_CARE_PROVIDER_SITE_OTHER): Payer: BC Managed Care – PPO | Admitting: *Deleted

## 2021-05-23 ENCOUNTER — Ambulatory Visit: Payer: BC Managed Care – PPO | Admitting: Internal Medicine

## 2021-05-23 ENCOUNTER — Ambulatory Visit: Payer: BC Managed Care – PPO | Admitting: Gastroenterology

## 2021-05-23 ENCOUNTER — Other Ambulatory Visit: Payer: Self-pay

## 2021-05-23 VITALS — Ht 68.0 in | Wt 172.0 lb

## 2021-05-23 DIAGNOSIS — Z1211 Encounter for screening for malignant neoplasm of colon: Secondary | ICD-10-CM

## 2021-05-23 MED ORDER — PEG 3350-KCL-NA BICARB-NACL 420 G PO SOLR: 4000.0000 mL | Freq: Once | ORAL | 0 refills | Status: AC

## 2021-05-23 NOTE — Progress Notes (Addendum)
Gastroenterology Pre-Procedure Review  Request Date: 05/23/2021 Requesting Physician: Dr. Legrand Rams, no previous TCS  PATIENT REVIEW QUESTIONS: The patient responded to the following health history questions as indicated:    1. Diabetes Melitis: no 2. Joint replacements in the past 12 months: no 3. Major health problems in the past 3 months: no 4. Has an artificial valve or MVP: no 5. Has a defibrillator: no 6. Has been advised in past to take antibiotics in advance of a procedure like teeth cleaning: no 7. Family history of colon cancer: no 8. Alcohol Use: yes, 2 beers daily 9. Illicit drug Use: no 10. History of sleep apnea:  no 11. History of coronary artery or other vascular stents placed within the last 12 months: no 12. History of any prior anesthesia complications: no 13. Body mass index is 26.15 kg/m.    MEDICATIONS & ALLERGIES:    Patient reports the following regarding taking any blood thinners:   Plavix? no Aspirin? no Coumadin? no Brilinta? no Xarelto? no Eliquis? no Pradaxa? no Savaysa? no Effient? no  Patient confirms/reports the following medications:  Current Outpatient Medications  Medication Sig Dispense Refill   dorzolamide-timolol (COSOPT) 22.3-6.8 MG/ML ophthalmic solution PLACE 1 DROP INTO RIGHT EYE TWICE DAILY     sildenafil (REVATIO) 20 MG tablet TAKE 1 TO 5 TABLETS AS NEEDED BY MOUTH FOR INTIMACY 90 tablet 3   VYZULTA 0.024 % SOLN Place 1 drop into the right eye at bedtime.     No current facility-administered medications for this visit.    Patient confirms/reports the following allergies:  No Known Allergies  No orders of the defined types were placed in this encounter.   AUTHORIZATION INFORMATION Primary Insurance: [place],  [place] #: [id number],  Group #: [id number] Pre-Cert / Auth required: No, not required per Jimmy Footman / Auth #: REF#: [id number]  SCHEDULE INFORMATION: Procedure has been scheduled as follows:  Date: 06/29/2021, Time: 1:00   Location: [facility] with Dr. Abbey Chatters  This Gastroenterology Pre-Precedure Review Form is being routed to the following provider(s): Neil Crouch, PA-C

## 2021-05-23 NOTE — Progress Notes (Signed)
Pt requested a work note.  Pt to call me back with fax number to send it to.

## 2021-05-23 NOTE — Progress Notes (Signed)
Ok to schedule.  ASA II 

## 2021-05-28 ENCOUNTER — Other Ambulatory Visit: Payer: Self-pay | Admitting: *Deleted

## 2021-05-28 NOTE — Progress Notes (Signed)
Mailed out prep instructions along with work note.

## 2021-06-29 ENCOUNTER — Ambulatory Visit (HOSPITAL_COMMUNITY): Payer: BC Managed Care – PPO | Admitting: Anesthesiology

## 2021-06-29 ENCOUNTER — Other Ambulatory Visit: Payer: Self-pay

## 2021-06-29 ENCOUNTER — Encounter (HOSPITAL_COMMUNITY)
Admission: RE | Disposition: A | Discharge status: Home / Self Care | Payer: Self-pay | Source: Ambulatory Visit | Attending: Internal Medicine

## 2021-06-29 ENCOUNTER — Ambulatory Visit (HOSPITAL_COMMUNITY)
Admission: RE | Admit: 2021-06-29 | Discharge: 2021-06-29 | Disposition: A | Discharge status: Home / Self Care | Payer: BC Managed Care – PPO | Source: Ambulatory Visit | Attending: Internal Medicine | Admitting: Internal Medicine

## 2021-06-29 ENCOUNTER — Encounter (HOSPITAL_COMMUNITY): Payer: Self-pay

## 2021-06-29 DIAGNOSIS — K6389 Other specified diseases of intestine: Secondary | ICD-10-CM | POA: Diagnosis not present

## 2021-06-29 DIAGNOSIS — K529 Noninfective gastroenteritis and colitis, unspecified: Secondary | ICD-10-CM | POA: Insufficient documentation

## 2021-06-29 DIAGNOSIS — Z87891 Personal history of nicotine dependence: Secondary | ICD-10-CM | POA: Insufficient documentation

## 2021-06-29 DIAGNOSIS — Z1211 Encounter for screening for malignant neoplasm of colon: Secondary | ICD-10-CM | POA: Diagnosis present

## 2021-06-29 DIAGNOSIS — K648 Other hemorrhoids: Secondary | ICD-10-CM | POA: Insufficient documentation

## 2021-06-29 DIAGNOSIS — D125 Benign neoplasm of sigmoid colon: Secondary | ICD-10-CM | POA: Diagnosis not present

## 2021-06-29 DIAGNOSIS — D124 Benign neoplasm of descending colon: Secondary | ICD-10-CM

## 2021-06-29 HISTORY — PX: BIOPSY: SHX5522

## 2021-06-29 HISTORY — PX: COLONOSCOPY WITH PROPOFOL: SHX5780

## 2021-06-29 HISTORY — PX: POLYPECTOMY: SHX5525

## 2021-06-29 SURGERY — COLONOSCOPY WITH PROPOFOL
Anesthesia: General

## 2021-06-29 MED ORDER — LACTATED RINGERS IV SOLN: INTRAVENOUS | Status: DC | PRN

## 2021-06-29 MED ORDER — PROPOFOL 500 MG/50ML IV EMUL
INTRAVENOUS | Status: AC
  Filled 2021-06-29: qty 50

## 2021-06-29 MED ORDER — PROPOFOL 10 MG/ML IV BOLUS
INTRAVENOUS | Status: DC | PRN
  Administered 2021-06-29 (×5): 50 mg via INTRAVENOUS

## 2021-06-29 MED ORDER — LACTATED RINGERS IV SOLN: INTRAVENOUS | Status: DC

## 2021-06-29 NOTE — Discharge Instructions (Addendum)
°  Colonoscopy Discharge Instructions  Read the instructions outlined below and refer to this sheet in the next few weeks. These discharge instructions provide you with general information on caring for yourself after you leave the hospital. Your doctor may also give you specific instructions. While your treatment has been planned according to the most current medical practices available, unavoidable complications occasionally occur.   ACTIVITY You may resume your regular activity, but move at a slower pace for the next 24 hours.  Take frequent rest periods for the next 24 hours.  Walking will help get rid of the air and reduce the bloated feeling in your belly (abdomen).  No driving for 24 hours (because of the medicine (anesthesia) used during the test).   Do not sign any important legal documents or operate any machinery for 24 hours (because of the anesthesia used during the test).  NUTRITION Drink plenty of fluids.  You may resume your normal diet as instructed by your doctor.  Begin with a light meal and progress to your normal diet. Heavy or fried foods are harder to digest and may make you feel sick to your stomach (nauseated).  Avoid alcoholic beverages for 24 hours or as instructed.  MEDICATIONS You may resume your normal medications unless your doctor tells you otherwise.  WHAT YOU CAN EXPECT TODAY Some feelings of bloating in the abdomen.  Passage of more gas than usual.  Spotting of blood in your stool or on the toilet paper.  IF YOU HAD POLYPS REMOVED DURING THE COLONOSCOPY: No aspirin products for 7 days or as instructed.  No alcohol for 7 days or as instructed.  Eat a soft diet for the next 24 hours.  FINDING OUT THE RESULTS OF YOUR TEST Not all test results are available during your visit. If your test results are not back during the visit, make an appointment with your caregiver to find out the results. Do not assume everything is normal if you have not heard from your  caregiver or the medical facility. It is important for you to follow up on all of your test results.  SEEK IMMEDIATE MEDICAL ATTENTION IF: You have more than a spotting of blood in your stool.  Your belly is swollen (abdominal distention).  You are nauseated or vomiting.  You have a temperature over 101.  You have abdominal pain or discomfort that is severe or gets worse throughout the day.   Your colonoscopy revealed 3 polyp(s) which I removed successfully. Await pathology results, my office will contact you. I recommend repeating colonoscopy in 5 years for surveillance purposes. I also took biopsies of your IC valve as well as it appeared somewhat nodular. I will be in contact with these results.   Follow up with Gi as needed,.   I hope you have a great rest of your week!  Elon Alas. Abbey Chatters, D.O. Gastroenterology and Hepatology [facility]

## 2021-06-29 NOTE — Anesthesia Postprocedure Evaluation (Signed)
Anesthesia Post Note  Patient: Marco Beard  Procedure(s) Performed: COLONOSCOPY WITH PROPOFOL POLYPECTOMY BIOPSY  Patient location during evaluation: Phase II Anesthesia Type: General Level of consciousness: awake Pain management: pain level controlled Vital Signs Assessment: post-procedure vital signs reviewed and stable Respiratory status: spontaneous breathing and respiratory function stable Cardiovascular status: blood pressure returned to baseline and stable Postop Assessment: no headache and no apparent nausea or vomiting Anesthetic complications: no Comments: Late entry   No notable events documented.   Last Vitals:  Vitals:   06/29/21 1238 06/29/21 1241  BP:  96/60  Pulse: 61   Resp: 15   Temp: 36.5 C   SpO2: 98%     Last Pain:  Vitals:   06/29/21 1241  TempSrc:   PainSc: 0-No pain                 Louann Sjogren

## 2021-06-29 NOTE — H&P (Signed)
Primary Care Physician:  Rosita Fire, MD Primary Gastroenterologist:  Dr. Abbey Chatters  Pre-Procedure History & Physical: HPI:  Marco Beard is a 55 y.o. male is here for a colonoscopy for colon cancer screening purposes.  Patient denies any family history of colorectal cancer.  No melena or hematochezia.  No abdominal pain or unintentional weight loss.  No change in bowel habits.  Overall feels well from a GI standpoint.  Past Medical History:  Diagnosis Date   Vitamin D deficiency     Past Surgical History:  Procedure Laterality Date   CERVICAL FUSION     MASS EXCISION Left 10/29/2013   Procedure: EXCISION 5CM SOFT TISSUE NEOPLASM OF ARM;  Surgeon: Jamesetta So, MD;  Location: [facility];  Service: General;  Laterality: Left;    Prior to Admission medications   Medication Sig Start Date End Date Taking? Authorizing Provider  dorzolamide-timolol (COSOPT) 22.3-6.8 MG/ML ophthalmic solution Place 1 drop into both eyes 2 (two) times daily. 07/12/19  Yes [provider]  sildenafil (REVATIO) 20 MG tablet TAKE 1 TO 5 TABLETS AS NEEDED BY MOUTH FOR INTIMACY 09/21/20  Yes Irine Seal, MD  VYZULTA 0.024 % SOLN Place 1 drop into the right eye at bedtime. 09/21/19  Yes [provider]    Allergies as of 05/28/2021   (No Known Allergies)    Family History  Problem Relation Age of Onset   Colon cancer Neg Hx     Social History   Socioeconomic History   Marital status: Married    Spouse name: Not on file   Number of children: Not on file   Years of education: Not on file   Highest education level: Not on file  Occupational History   Not on file  Tobacco Use   Smoking status: Former    Packs/day: 0.50    Types: Cigarettes   Smokeless tobacco: Never  Vaping Use   Vaping Use: Former  Substance and Sexual Activity   Alcohol use: Yes    Alcohol/week: 3.0 standard drinks    Types: 3 Cans of beer per week    Comment: occ on weekend   Drug use: No   Sexual activity:  Not on file  Other Topics Concern   Not on file  Social History Narrative   Not on file   Social Determinants of Health   Financial Resource Strain: Not on file  Food Insecurity: Not on file  Transportation Needs: Not on file  Physical Activity: Not on file  Stress: Not on file  Social Connections: Not on file  Intimate Partner Violence: Not on file    Review of Systems: See HPI, otherwise negative ROS  Physical Exam: Vital signs in last 24 hours:     General:   Alert,  Well-developed, well-nourished, pleasant and cooperative in NAD Head:  Normocephalic and atraumatic. Eyes:  Sclera clear, no icterus.   Conjunctiva pink. Ears:  Normal auditory acuity. Nose:  No deformity, discharge,  or lesions. Mouth:  No deformity or lesions, dentition normal. Neck:  Supple; no masses or thyromegaly. Lungs:  Clear throughout to auscultation.   No wheezes, crackles, or rhonchi. No acute distress. Heart:  Regular rate and rhythm; no murmurs, clicks, rubs,  or gallops. Abdomen:  Soft, nontender and nondistended. No masses, hepatosplenomegaly or hernias noted. Normal bowel sounds, without guarding, and without rebound.   Msk:  Symmetrical without gross deformities. Normal posture. Extremities:  Without clubbing or edema. Neurologic:  Alert and  oriented x4;  grossly normal neurologically. Skin:  Intact without significant lesions or rashes. Cervical Nodes:  No significant cervical adenopathy. Psych:  Alert and cooperative. Normal mood and affect.  Impression/Plan: Marco Beard is here for a colonoscopy to be performed for colon cancer screening purposes.  The risks of the procedure including infection, bleed, or perforation as well as benefits, limitations, alternatives and imponderables have been reviewed with the patient. Questions have been answered. All parties agreeable.

## 2021-06-29 NOTE — Anesthesia Preprocedure Evaluation (Signed)
Anesthesia Evaluation  Patient identified by MRN, date of birth, ID band Patient awake    Reviewed: Allergy & Precautions, H&P , NPO status , Patient's Chart, lab work & pertinent test results, reviewed documented beta blocker date and time   Airway Mallampati: II  TM Distance: >3 FB Neck ROM: full    Dental no notable dental hx.    Pulmonary neg pulmonary ROS, former smoker,    Pulmonary exam normal breath sounds clear to auscultation       Cardiovascular Exercise Tolerance: Good negative cardio ROS   Rhythm:regular Rate:Normal     Neuro/Psych negative neurological ROS  negative psych ROS   GI/Hepatic negative GI ROS, Neg liver ROS,   Endo/Other  negative endocrine ROS  Renal/GU negative Renal ROS  negative genitourinary   Musculoskeletal   Abdominal   Peds  Hematology negative hematology ROS (+)   Anesthesia Other Findings   Reproductive/Obstetrics negative OB ROS                             Anesthesia Physical Anesthesia Plan  ASA: 2  Anesthesia Plan: General   Post-op Pain Management:    Induction:   PONV Risk Score and Plan: Propofol infusion  Airway Management Planned:   Additional Equipment:   Intra-op Plan:   Post-operative Plan:   Informed Consent: I have reviewed the patients History and Physical, chart, labs and discussed the procedure including the risks, benefits and alternatives for the proposed anesthesia with the patient or authorized representative who has indicated his/her understanding and acceptance.     Dental Advisory Given  Plan Discussed with: CRNA  Anesthesia Plan Comments:         Anesthesia Quick Evaluation  

## 2021-06-29 NOTE — Op Note (Signed)
[facility] Patient Name: Marco Beard Procedure Date: 06/29/2021 12:11 PM MRN: [id number] Date of Birth: [birth year] Attending MD: Elon Alas. Abbey Chatters DO CSN: [id number] Age: 55 Admit Type: Outpatient Procedure:                Colonoscopy Indications:              Screening for colorectal malignant neoplasm Providers:                Elon Alas. Abbey Chatters, DO, Tammy Vaught, RN, Aram Candela Referring MD:              Medicines:                See the Anesthesia note for documentation of the                            administered medications Complications:            No immediate complications. Estimated Blood Loss:     Estimated blood loss was minimal. Procedure:                Pre-Anesthesia Assessment:                           - The anesthesia plan was to use monitored                            anesthesia care (MAC).                           After obtaining informed consent, the colonoscope                            was passed under direct vision. Throughout the                            procedure, the patient's blood pressure, pulse, and                            oxygen saturations were monitored continuously. The                            [id number]-HQ190L ([id number]) scope was introduced through                            the anus and advanced to the the terminal ileum,                            with identification of the appendiceal orifice and                            IC valve. The colonoscopy was performed without                            difficulty. The patient tolerated the procedure  well. The quality of the bowel preparation was                            evaluated using the BBPS [facility] Bowel Preparation                            Scale) with scores of: Right Colon = 3, Transverse                            Colon = 3 and Left Colon = 3 (entire mucosa seen                            well with no residual staining,  small fragments of                            stool or opaque liquid). The total BBPS score                            equals 9. Scope In: 12:20:00 PM Scope Out: 12:35:57 PM Scope Withdrawal Time: 0 hours 12 minutes 53 seconds  Total Procedure Duration: 0 hours 15 minutes 57 seconds  Findings:      The perianal and digital rectal examinations were normal.      Non-bleeding internal hemorrhoids were found during endoscopy.      A 6 mm polyp was found in the sigmoid colon. The polyp was pedunculated.       The polyp was removed with a cold snare. Resection and retrieval were       complete.      Two sessile polyps were found in the descending colon. The polyps were 3       to 5 mm in size. These polyps were removed with a cold snare. Resection       and retrieval were complete.      A localized area of nodular mucosa was found at the ileocecal valve.       Biopsies were taken with a cold forceps for histology.      The exam was otherwise without abnormality. Impression:               - Non-bleeding internal hemorrhoids.                           - One 6 mm polyp in the sigmoid colon, removed with                            a cold snare. Resected and retrieved.                           - Two 3 to 5 mm polyps in the descending colon,                            removed with a cold snare. Resected and retrieved.                           - Nodular mucosa at the ileocecal valve. Biopsied.                           -  The examination was otherwise normal. Moderate Sedation:      Per Anesthesia Care Recommendation:           - Patient has a contact number available for                            emergencies. The signs and symptoms of potential                            delayed complications were discussed with the                            patient. Return to normal activities tomorrow.                            Written discharge instructions were provided to the                             patient.                           - Resume previous diet.                           - Continue present medications.                           - Await pathology results.                           - Repeat colonoscopy in 5 years for surveillance.                           - Return to GI clinic PRN. Procedure Code(s):        --- Professional ---                           [id number], Colonoscopy, flexible; with removal of                            tumor(s), polyp(s), or other lesion(s) by snare                            technique                           45380, 12, Colonoscopy, flexible; with biopsy,                            single or multiple Diagnosis Code(s):        --- Professional ---                           K63.5, Polyp of colon                           Z12.11, Encounter for screening for malignant  neoplasm of colon                           K63.89, Other specified diseases of intestine                           K64.8, Other hemorrhoids CPT copyright 2019 American Medical Association. All rights reserved. The codes documented in this report are preliminary and upon coder review may  be revised to meet current compliance requirements. Elon Alas. Abbey Chatters, DO Elizabeth Abbey Chatters, DO 06/29/2021 12:38:38 PM This report has been signed electronically. Number of Addenda: 0

## 2021-06-29 NOTE — Transfer of Care (Signed)
Immediate Anesthesia Transfer of Care Note  Patient: Marco Beard  Procedure(s) Performed: COLONOSCOPY WITH PROPOFOL POLYPECTOMY BIOPSY  Patient Location: PACU  Anesthesia Type:MAC  Level of Consciousness: awake, alert  and oriented  Airway & Oxygen Therapy: Patient Spontanous Breathing and Patient connected to nasal cannula oxygen  Post-op Assessment: Report given to RN and Post -op Vital signs reviewed and stable  Post vital signs: Reviewed and stable  Last Vitals:  Vitals Value Taken Time  BP    Temp    Pulse    Resp    SpO2      Last Pain:  Vitals:   06/29/21 1216  TempSrc:   PainSc: 0-No pain      Patients Stated Pain Goal: 9 (90/93/11 2162)  Complications: No notable events documented.

## 2021-07-03 LAB — SURGICAL PATHOLOGY

## 2021-07-05 ENCOUNTER — Encounter (HOSPITAL_COMMUNITY): Payer: Self-pay | Admitting: Internal Medicine

## 2021-09-20 ENCOUNTER — Ambulatory Visit: Payer: BC Managed Care – PPO | Admitting: Urology

## 2021-10-25 ENCOUNTER — Ambulatory Visit (INDEPENDENT_AMBULATORY_CARE_PROVIDER_SITE_OTHER): Payer: BC Managed Care – PPO | Admitting: Urology

## 2021-10-25 VITALS — BP 157/69 | HR 73

## 2021-10-25 DIAGNOSIS — Z125 Encounter for screening for malignant neoplasm of prostate: Secondary | ICD-10-CM | POA: Diagnosis not present

## 2021-10-25 DIAGNOSIS — L729 Follicular cyst of the skin and subcutaneous tissue, unspecified: Secondary | ICD-10-CM

## 2021-10-25 DIAGNOSIS — N5201 Erectile dysfunction due to arterial insufficiency: Secondary | ICD-10-CM | POA: Diagnosis not present

## 2021-10-25 MED ORDER — SILDENAFIL CITRATE 20 MG PO TABS: ORAL_TABLET | ORAL | 3 refills | Status: DC

## 2021-10-25 NOTE — Progress Notes (Signed)
?Subjective: ? ?1. Scrotal cyst   ?2. Erectile dysfunction due to arterial insufficiency   ?3. Prostate cancer screening   ?  ? ?Mr. Goetting returns today in f/u for his history of testicular pain and a scrotal cyst.  The cyst is larger and he has some pain that is mild.   He has minimal voiding complaints.  His IPSS is 4.   ? ?He has ED and has been using sildenafil with success.  He needs a refill.  ? ?He is on doxycycline for a facial skin infection/irritation.  ? ?  ? ?ROS: ? ?ROS:  ?A complete review of systems was performed.  All systems are negative except for pertinent findings as noted.  ? ?Review of Systems  ?All other systems reviewed and are negative. ? ?No Known Allergies ? ?Outpatient Encounter Medications as of 10/25/2021  ?Medication Sig  ? dorzolamide-timolol (COSOPT) 22.3-6.8 MG/ML ophthalmic solution Place 1 drop into both eyes 2 (two) times daily.  ? doxycycline (VIBRAMYCIN) 100 MG capsule Take 100 mg by mouth 2 (two) times daily.  ? VYZULTA 0.024 % SOLN Place 1 drop into the right eye at bedtime.  ? [DISCONTINUED] sildenafil (REVATIO) 20 MG tablet TAKE 1 TO 5 TABLETS AS NEEDED BY MOUTH FOR INTIMACY  ? sildenafil (REVATIO) 20 MG tablet TAKE 1 TO 5 TABLETS AS NEEDED BY MOUTH FOR INTIMACY  ? ?No facility-administered encounter medications on file as of 10/25/2021.  ? ? ?Past Medical History:  ?Diagnosis Date  ? Vitamin D deficiency   ? ? ?Past Surgical History:  ?Procedure Laterality Date  ? BIOPSY  06/29/2021  ? Procedure: BIOPSY;  Surgeon: Eloise Harman, DO;  [facility];  Service: Endoscopy;;  ? CERVICAL FUSION    ? COLONOSCOPY WITH PROPOFOL N/A 06/29/2021  ? Procedure: COLONOSCOPY WITH PROPOFOL;  Surgeon: Eloise Harman, DO;  [facility];  Service: Endoscopy;  Laterality: N/A;  1:00 / ASA II  ? MASS EXCISION Left 10/29/2013  ? Procedure: EXCISION 5CM SOFT TISSUE NEOPLASM OF ARM;  Surgeon: Jamesetta So, MD;  Location: [facility];  Service: General;  Laterality: Left;   ? POLYPECTOMY  06/29/2021  ? Procedure: POLYPECTOMY;  Surgeon: Eloise Harman, DO;  [facility];  Service: Endoscopy;;  ? ? ?Social History  ? ?Socioeconomic History  ? Marital status: Married  ?  Spouse name: Not on file  ? Number of children: Not on file  ? Years of education: Not on file  ? Highest education level: Not on file  ?Occupational History  ? Not on file  ?Tobacco Use  ? Smoking status: Former  ?  Packs/day: 0.50  ?  Types: Cigarettes  ? Smokeless tobacco: Never  ?Vaping Use  ? Vaping Use: Former  ?Substance and Sexual Activity  ? Alcohol use: Yes  ?  Alcohol/week: 3.0 standard drinks  ?  Types: 3 Cans of beer per week  ?  Comment: occ on weekend  ? Drug use: No  ? Sexual activity: Not on file  ?Other Topics Concern  ? Not on file  ?Social History Narrative  ? Not on file  ? ?Social Determinants of Health  ? ?Financial Resource Strain: Not on file  ?Food Insecurity: Not on file  ?Transportation Needs: Not on file  ?Physical Activity: Not on file  ?Stress: Not on file  ?Social Connections: Not on file  ?Intimate Partner Violence: Not on file  ? ? ?Family History  ?Problem Relation Age of  Onset  ? Colon cancer Neg Hx   ? ? ? ? ? ?Objective: ?BP (!) 157/69   Pulse 73  ? ? ? ?Physical Exam ?Vitals reviewed.  ?Constitutional:   ?   Appearance: Normal appearance.  ?Genitourinary: ?   Comments: There is a 78m cyst in the right posterior scrotum that is slightly larger.  The genital exam is otherwise unremarkable.  ? ?AP without lesions. ?NST without mass. ?Prostate 1.5+ benign. ?SV non-palpable.  ?Neurological:  ?   Mental Status: He is alert.  ? ? ?Lab Results:  ?No results found for this or any previous visit (from the past 24 hour(s)). ?  ? ? ? ? ?Studies/Results: ?No results found. ? ? ? ?Assessment & Plan: ?ED.  He is doing well on sildenafil which I refilled. ? ?Scrotal cyst.  No major change and his groin pain has improved. ? ?Prostate cancer screening.  His exam is benign.   His PSA  was 1.5 last year.  He will have it done with Dr. FLegrand Ramsat his next f/u and then have one prior to his next visit.  ? ?Meds ordered this encounter  ?Medications  ? sildenafil (REVATIO) 20 MG tablet  ?  Sig: TAKE 1 TO 5 TABLETS AS NEEDED BY MOUTH FOR INTIMACY  ?  Dispense:  90 tablet  ?  Refill:  3  ?  Ignore the 30 tab script.  ?  ? ?Orders Placed This Encounter  ?Procedures  ? PSA, total and free  ?  Standing Status:   Future  ?  Standing Expiration Date:   10/26/2022  ? Urinalysis, Routine w reflex microscopic  ?  ? ? ?Return in about 1 year (around 10/26/2022) for with PSA. . ? ? ?CC: FCarrolyn Meiers MD   ? ? ? ?JIrine Seal?10/28/2021 ? ?

## 2021-10-26 LAB — URINALYSIS, ROUTINE W REFLEX MICROSCOPIC
Bilirubin, UA: NEGATIVE
Glucose, UA: NEGATIVE
Ketones, UA: NEGATIVE
Leukocytes,UA: NEGATIVE
Nitrite, UA: NEGATIVE
Protein,UA: NEGATIVE
RBC, UA: NEGATIVE
Specific Gravity, UA: 1.02 (ref 1.005–1.030)
Urobilinogen, Ur: 2 mg/dL — ABNORMAL HIGH (ref 0.2–1.0)
pH, UA: 7 (ref 5.0–7.5)

## 2021-10-28 ENCOUNTER — Encounter: Payer: Self-pay | Admitting: Urology

## 2021-11-02 ENCOUNTER — Other Ambulatory Visit: Payer: Self-pay | Admitting: Urology

## 2021-11-02 DIAGNOSIS — N5201 Erectile dysfunction due to arterial insufficiency: Secondary | ICD-10-CM

## 2022-10-17 ENCOUNTER — Other Ambulatory Visit: Payer: BC Managed Care – PPO

## 2022-10-24 ENCOUNTER — Ambulatory Visit: Payer: BC Managed Care – PPO | Admitting: Urology

## 2022-10-24 ENCOUNTER — Other Ambulatory Visit: Payer: BC Managed Care – PPO

## 2022-10-25 ENCOUNTER — Other Ambulatory Visit: Payer: BC Managed Care – PPO

## 2022-10-25 DIAGNOSIS — Z125 Encounter for screening for malignant neoplasm of prostate: Secondary | ICD-10-CM

## 2022-10-26 LAB — PSA, TOTAL AND FREE
PSA, Free Pct: 30 %
PSA, Free: 0.24 ng/mL
Prostate Specific Ag, Serum: 0.8 ng/mL (ref 0.0–4.0)

## 2022-10-31 ENCOUNTER — Ambulatory Visit (INDEPENDENT_AMBULATORY_CARE_PROVIDER_SITE_OTHER): Payer: BC Managed Care – PPO | Admitting: Urology

## 2022-10-31 ENCOUNTER — Encounter: Payer: Self-pay | Admitting: Urology

## 2022-10-31 VITALS — BP 134/78 | HR 72 | Ht 68.0 in | Wt 175.0 lb

## 2022-10-31 DIAGNOSIS — N5201 Erectile dysfunction due to arterial insufficiency: Secondary | ICD-10-CM | POA: Diagnosis not present

## 2022-10-31 DIAGNOSIS — Z125 Encounter for screening for malignant neoplasm of prostate: Secondary | ICD-10-CM

## 2022-10-31 DIAGNOSIS — L729 Follicular cyst of the skin and subcutaneous tissue, unspecified: Secondary | ICD-10-CM | POA: Diagnosis not present

## 2022-10-31 LAB — URINALYSIS, ROUTINE W REFLEX MICROSCOPIC
Bilirubin, UA: NEGATIVE
Glucose, UA: NEGATIVE
Ketones, UA: NEGATIVE
Leukocytes,UA: NEGATIVE
Nitrite, UA: NEGATIVE
Protein,UA: NEGATIVE
Specific Gravity, UA: 1.025 (ref 1.005–1.030)
Urobilinogen, Ur: 0.2 mg/dL (ref 0.2–1.0)
pH, UA: 5.5 (ref 5.0–7.5)

## 2022-10-31 LAB — MICROSCOPIC EXAMINATION
Bacteria, UA: NONE SEEN
WBC, UA: NONE SEEN /hpf (ref 0–5)

## 2022-10-31 MED ORDER — SILDENAFIL CITRATE 20 MG PO TABS: ORAL_TABLET | ORAL | 3 refills | Status: DC

## 2022-10-31 NOTE — Progress Notes (Signed)
Subjective:  1. Scrotal cyst   2. Erectile dysfunction due to arterial insufficiency   3. Prostate cancer screening      Marco Beard returns today in f/u for his history of testicular pain and a scrotal cyst.  The cyst is larger and he has some intermittent sharp pain that can radiate down his leg.  The pain is mild/mod.   He has minimal voiding complaints.  His IPSS is 2 with nocturia x 1.   His PSA is down to 0.8.    He has ED and has been using sildenafil with success.        IPSS     Row Name 10/31/22 1400         International Prostate Symptom Score   How often have you had the sensation of not emptying your bladder? Not at All     How often have you had to urinate less than every two hours? Not at All     How often have you found you stopped and started again several times when you urinated? Not at All     How often have you found it difficult to postpone urination? Less than 1 in 5 times     How often have you had a weak urinary stream? Not at All     How often have you had to strain to start urination? Not at All     How many times did you typically get up at night to urinate? 1 Time     Total IPSS Score 2       Quality of Life due to urinary symptoms   If you were to spend the rest of your life with your urinary condition just the way it is now how would you feel about that? Delighted               ROS:  ROS:  A complete review of systems was performed.  All systems are negative except for pertinent findings as noted.   Review of Systems  All other systems reviewed and are negative.   No Known Allergies  Outpatient Encounter Medications as of 10/31/2022  Medication Sig   dorzolamide-timolol (COSOPT) 22.3-6.8 MG/ML ophthalmic solution Place 1 drop into both eyes 2 (two) times daily.   [DISCONTINUED] doxycycline (VIBRAMYCIN) 100 MG capsule Take 100 mg by mouth 2 (two) times daily.   sildenafil (REVATIO) 20 MG tablet TAKE 1 TO 5 TABLETS AS NEEDED BY MOUTH  FOR INTIMACY   VYZULTA 0.024 % SOLN Place 1 drop into the right eye at bedtime. (Patient not taking: Reported on 10/31/2022)   [DISCONTINUED] sildenafil (REVATIO) 20 MG tablet TAKE 1 TO 5 TABLETS AS NEEDED BY MOUTH FOR INTIMACY (Patient not taking: Reported on 10/31/2022)   No facility-administered encounter medications on file as of 10/31/2022.    Past Medical History:  Diagnosis Date   Vitamin D deficiency     Past Surgical History:  Procedure Laterality Date   BIOPSY  06/29/2021   Procedure: BIOPSY;  Surgeon: Lanelle Bal, DO;  [facility];  Service: Endoscopy;;   CERVICAL FUSION     COLONOSCOPY WITH PROPOFOL N/A 06/29/2021   Procedure: COLONOSCOPY WITH PROPOFOL;  Surgeon: Lanelle Bal, DO;  [facility];  Service: Endoscopy;  Laterality: N/A;  1:00 / ASA II   MASS EXCISION Left 10/29/2013   Procedure: EXCISION 5CM SOFT TISSUE NEOPLASM OF ARM;  Surgeon: Dalia Heading, MD;  Location: [facility];  Service:  General;  Laterality: Left;   POLYPECTOMY  06/29/2021   Procedure: POLYPECTOMY;  Surgeon: Lanelle Bal, DO;  [facility];  Service: Endoscopy;;    Social History   Socioeconomic History   Marital status: Married    Spouse name: Not on file   Number of children: Not on file   Years of education: Not on file   Highest education level: Not on file  Occupational History   Not on file  Tobacco Use   Smoking status: Former    Packs/day: .5    Types: Cigarettes   Smokeless tobacco: Never  Vaping Use   Vaping Use: Former  Substance and Sexual Activity   Alcohol use: Yes    Alcohol/week: 3.0 standard drinks of alcohol    Types: 3 Cans of beer per week    Comment: occ on weekend   Drug use: No   Sexual activity: Not on file  Other Topics Concern   Not on file  Social History Narrative   Not on file   Social Determinants of Health   Financial Resource Strain: Not on file  Food Insecurity: Not on file  Transportation Needs: Not  on file  Physical Activity: Not on file  Stress: Not on file  Social Connections: Not on file  Intimate Partner Violence: Not on file    Family History  Problem Relation Age of Onset   Colon cancer Neg Hx        Objective: BP 134/78   Pulse 72   Ht 5\' 8"  (1.727 m)   Wt 175 lb (79.4 kg)   BMI 26.61 kg/m     Physical Exam Vitals reviewed.  Constitutional:      Appearance: Normal appearance.  Genitourinary:    Comments: There is a 25mm cyst in the right posterior scrotum that is slightly larger.  The genital exam is otherwise unremarkable.    Neurological:     Mental Status: He is alert.     Lab Results:  Results for orders placed or performed in visit on 10/31/22 (from the past 24 hour(s))  Urinalysis, Routine w reflex microscopic     Status: Abnormal   Collection Time: 10/31/22  2:21 PM  Result Value Ref Range   Specific Gravity, UA 1.025 1.005 - 1.030   pH, UA 5.5 5.0 - 7.5   Color, UA Yellow Yellow   Appearance Ur Clear Clear   Leukocytes,UA Negative Negative   Protein,UA Negative Negative/Trace   Glucose, UA Negative Negative   Ketones, UA Negative Negative   RBC, UA Trace (A) Negative   Bilirubin, UA Negative Negative   Urobilinogen, Ur 0.2 0.2 - 1.0 mg/dL   Nitrite, UA Negative Negative   Microscopic Examination See below:    Narrative   Performed at:  [address] - [facility] [address] [place], [place]  [id number] Lab Director: Chinita Pester MT, Phone:  [phone]  Microscopic Examination     Status: None   Collection Time: 10/31/22  2:21 PM   Urine  Result Value Ref Range   WBC, UA None seen 0 - 5 /hpf   RBC, Urine 0-2 0 - 2 /hpf   Epithelial Cells (non renal) 0-10 0 - 10 /hpf   Bacteria, UA None seen None seen/Few   Narrative   Performed at:  [address] - [facility] [address], [place], [place]  [id number] Lab Director: Chinita Pester MT, Phone:  [phone]      UA has trace blood but is  o/w clear.     Studies/Results: No results found.    Assessment & Plan: ED.  He is doing well on sildenafil which I refilled.  Scrotal cyst.  Is slightly larger but non-tender.  We will continue to monitor it.   Prostate cancer screening.  His PSA is down to 0.8mg .  Meds ordered this encounter  Medications   sildenafil (REVATIO) 20 MG tablet    Sig: TAKE 1 TO 5 TABLETS AS NEEDED BY MOUTH FOR INTIMACY    Dispense:  90 tablet    Refill:  3     Orders Placed This Encounter  Procedures   Microscopic Examination   Urinalysis, Routine w reflex microscopic   PSA, total and free    Standing Status:   Future    Standing Expiration Date:   10/31/2023      Return in about 1 year (around 10/31/2023) for with PSA.   CC: Benetta Spar, MD      Bjorn Pippin 11/01/2022

## 2022-11-05 ENCOUNTER — Other Ambulatory Visit: Payer: Self-pay | Admitting: Optometry

## 2022-11-05 DIAGNOSIS — H53461 Homonymous bilateral field defects, right side: Secondary | ICD-10-CM

## 2022-11-15 ENCOUNTER — Ambulatory Visit (HOSPITAL_COMMUNITY): Admission: RE | Admit: 2022-11-15 | Payer: BC Managed Care – PPO | Source: Ambulatory Visit

## 2022-11-22 ENCOUNTER — Ambulatory Visit (HOSPITAL_COMMUNITY)
Admission: RE | Admit: 2022-11-22 | Discharge: 2022-11-22 | Disposition: A | Discharge status: Home / Self Care | Payer: BC Managed Care – PPO | Source: Ambulatory Visit | Attending: Optometry | Admitting: Optometry

## 2022-11-22 DIAGNOSIS — H53461 Homonymous bilateral field defects, right side: Secondary | ICD-10-CM | POA: Diagnosis present

## 2022-11-22 MED ORDER — IOHEXOL 300 MG/ML  SOLN
75.0000 mL | Freq: Once | INTRAMUSCULAR | Status: AC | PRN
  Administered 2022-11-22: 75 mL via INTRAVENOUS

## 2023-08-14 ENCOUNTER — Encounter: Payer: Self-pay | Admitting: Urology

## 2023-10-30 ENCOUNTER — Other Ambulatory Visit: Payer: BC Managed Care – PPO

## 2023-11-06 ENCOUNTER — Ambulatory Visit: Payer: BC Managed Care – PPO | Admitting: Urology

## 2023-11-10 ENCOUNTER — Other Ambulatory Visit: Payer: Self-pay

## 2023-11-10 DIAGNOSIS — Z125 Encounter for screening for malignant neoplasm of prostate: Secondary | ICD-10-CM

## 2023-11-10 NOTE — Addendum Note (Signed)
 Addended by: Dyke Glasser on: 11/10/2023 08:50 AM   Modules accepted: Orders

## 2023-11-14 ENCOUNTER — Other Ambulatory Visit: Payer: BC Managed Care – PPO

## 2023-11-14 DIAGNOSIS — Z125 Encounter for screening for malignant neoplasm of prostate: Secondary | ICD-10-CM

## 2023-11-15 LAB — PSA: Prostate Specific Ag, Serum: 1.3 ng/mL (ref 0.0–4.0)

## 2023-11-17 ENCOUNTER — Ambulatory Visit: Payer: Self-pay

## 2023-11-27 ENCOUNTER — Ambulatory Visit: Payer: BC Managed Care – PPO | Admitting: Urology

## 2023-11-27 VITALS — BP 149/81 | HR 76

## 2023-11-27 DIAGNOSIS — L729 Follicular cyst of the skin and subcutaneous tissue, unspecified: Secondary | ICD-10-CM | POA: Diagnosis not present

## 2023-11-27 DIAGNOSIS — Z125 Encounter for screening for malignant neoplasm of prostate: Secondary | ICD-10-CM

## 2023-11-27 DIAGNOSIS — N5201 Erectile dysfunction due to arterial insufficiency: Secondary | ICD-10-CM | POA: Diagnosis not present

## 2023-11-27 LAB — URINALYSIS, ROUTINE W REFLEX MICROSCOPIC
Bilirubin, UA: NEGATIVE
Glucose, UA: NEGATIVE
Ketones, UA: NEGATIVE
Leukocytes,UA: NEGATIVE
Nitrite, UA: NEGATIVE
Protein,UA: NEGATIVE
RBC, UA: NEGATIVE
Specific Gravity, UA: 1.025 (ref 1.005–1.030)
Urobilinogen, Ur: 1 mg/dL (ref 0.2–1.0)
pH, UA: 6 (ref 5.0–7.5)

## 2023-11-27 MED ORDER — SILDENAFIL CITRATE 20 MG PO TABS: ORAL_TABLET | ORAL | 3 refills | Status: AC

## 2023-11-27 NOTE — Progress Notes (Unsigned)
 Subjective:  1. Erectile dysfunction due to arterial insufficiency   2. Prostate cancer screening   3. Scrotal cyst     11/27/23: Marco Beard returns today in f/u.   His PSA remains low at 1.3.  He is voiding well with an IPSS of 2 with nocturia x 1.  He has ED and has been on sildenafil  20-40mg .  He continues to have some testicular pain and enlargement of the scrotal cyst.  The pain can radiate down the leg.   UA is clear.   10/31/22: Mr. Marco Beard returns today in f/u for his history of testicular pain and a scrotal cyst.  The cyst is larger and he has some intermittent sharp pain that can radiate down his leg.  The pain is mild/mod.   He has minimal voiding complaints.  His IPSS is 2 with nocturia x 1.   His PSA is down to 0.8.    He has ED and has been using sildenafil  with success.        IPSS     Row Name 11/27/23 1300         International Prostate Symptom Score   How often have you had the sensation of not emptying your bladder? Not at All     How often have you had to urinate less than every two hours? Not at All     How often have you found you stopped and started again several times when you urinated? Not at All     How often have you found it difficult to postpone urination? Less than 1 in 5 times     How often have you had a weak urinary stream? Not at All     How often have you had to strain to start urination? Not at All     How many times did you typically get up at night to urinate? 1 Time     Total IPSS Score 2       Quality of Life due to urinary symptoms   If you were to spend the rest of your life with your urinary condition just the way it is now how would you feel about that? Delighted                ROS:  ROS:  A complete review of systems was performed.  All systems are negative except for pertinent findings as noted.   Review of Systems  All other systems reviewed and are negative.   No Known Allergies  Outpatient Encounter Medications as of  11/27/2023  Medication Sig   dorzolamide-timolol (COSOPT) 22.3-6.8 MG/ML ophthalmic solution Place 1 drop into both eyes 2 (two) times daily.   [DISCONTINUED] sildenafil  (REVATIO ) 20 MG tablet TAKE 1 TO 5 TABLETS AS NEEDED BY MOUTH FOR INTIMACY   sildenafil  (REVATIO ) 20 MG tablet TAKE 1 TO 5 TABLETS AS NEEDED BY MOUTH FOR INTIMACY   [DISCONTINUED] VYZULTA 0.024 % SOLN Place 1 drop into the right eye at bedtime. (Patient not taking: Reported on 11/27/2023)   No facility-administered encounter medications on file as of 11/27/2023.    Past Medical History:  Diagnosis Date   Vitamin D deficiency     Past Surgical History:  Procedure Laterality Date   BIOPSY  06/29/2021   Procedure: BIOPSY;  Surgeon: Vinetta Greening, DO;  [facility];  Service: Endoscopy;;   CERVICAL FUSION     COLONOSCOPY WITH PROPOFOL  N/A 06/29/2021   Procedure: COLONOSCOPY WITH PROPOFOL ;  Surgeon: Goble Last  K, DO;  [facility];  Service: Endoscopy;  Laterality: N/A;  1:00 / ASA II   MASS EXCISION Left 10/29/2013   Procedure: EXCISION 5CM SOFT TISSUE NEOPLASM OF ARM;  Surgeon: Beau Bound, MD;  Location: [facility];  Service: General;  Laterality: Left;   POLYPECTOMY  06/29/2021   Procedure: POLYPECTOMY;  Surgeon: Vinetta Greening, DO;  [facility];  Service: Endoscopy;;    Social History   Socioeconomic History   Marital status: Married    Spouse name: Not on file   Number of children: Not on file   Years of education: Not on file   Highest education level: Not on file  Occupational History   Not on file  Tobacco Use   Smoking status: Former    Current packs/day: 0.50    Types: Cigarettes   Smokeless tobacco: Never  Vaping Use   Vaping status: Former  Substance and Sexual Activity   Alcohol use: Yes    Alcohol/week: 3.0 standard drinks of alcohol    Types: 3 Cans of beer per week    Comment: occ on weekend   Drug use: No   Sexual activity: Not on file  Other  Topics Concern   Not on file  Social History Narrative   Not on file   Social Drivers of Health   Financial Resource Strain: Not on file  Food Insecurity: Not on file  Transportation Needs: Not on file  Physical Activity: Not on file  Stress: Not on file  Social Connections: Not on file  Intimate Partner Violence: Not on file    Family History  Problem Relation Age of Onset   Colon cancer Neg Hx        Objective: BP (!) 149/81   Pulse 76     Physical Exam Vitals reviewed.  Constitutional:      Appearance: Normal appearance.  Genitourinary:    Comments: There is a 25mm cyst in the right posterior scrotum that is stable.  The genital exam is otherwise unremarkable.   AP/NST without mass. Prostate 1+ benign. SV non-palpable. Neurological:     Mental Status: He is alert.     Lab Results:  Results for orders placed or performed in visit on 11/27/23 (from the past 24 hours)  Urinalysis, Routine w reflex microscopic     Status: None   Collection Time: 11/27/23  1:04 PM  Result Value Ref Range   Specific Gravity, UA 1.025 1.005 - 1.030   pH, UA 6.0 5.0 - 7.5   Color, UA Yellow Yellow   Appearance Ur Clear Clear   Leukocytes,UA Negative Negative   Protein,UA Negative Negative/Trace   Glucose, UA Negative Negative   Ketones, UA Negative Negative   RBC, UA Negative Negative   Bilirubin, UA Negative Negative   Urobilinogen, Ur 1.0 0.2 - 1.0 mg/dL   Nitrite, UA Negative Negative   Microscopic Examination Comment    Narrative   Performed at:  [address] - [facility] [address], [place], [place]  [id number] Lab Director: Liliana Regulus MT, Phone:  [phone]       UA is clear.    Studies/Results: No results found.    Assessment & Plan: ED.  He is doing well on sildenafil  which I refilled.  Scrotal cyst.  Is stable but non-tender.  We will continue to monitor it.   Prostate cancer screening.  His PSA is minimally increased 1.3.  Meds  ordered this encounter  Medications  sildenafil  (REVATIO ) 20 MG tablet    Sig: TAKE 1 TO 5 TABLETS AS NEEDED BY MOUTH FOR INTIMACY    Dispense:  90 tablet    Refill:  3     Orders Placed This Encounter  Procedures   Urinalysis, Routine w reflex microscopic   PSA, total and free    Standing Status:   Future    Expected Date:   11/19/2024    Expiration Date:   11/26/2024   Ambulatory referral to Urology    Referral Priority:   Urgent    Referral Type:   Consultation    Referral Reason:   Specialty Services Required    Referred to Provider:   Homero Luster, MD    Requested Specialty:   Urology    Number of Visits Requested:   1      Return in about 1 year (around 11/26/2024) for in [place] with a PSA that can be done in [place]. .   CC: Wyvonna Heidelberg, MD      Homero Luster 11/28/2023
